# Patient Record
Sex: Female | Born: 1960 | Race: White | Hispanic: No | Marital: Married | Smoking: Never smoker
Health system: Southern US, Community
[De-identification: ages and names within clinical notes are randomized; demographics above are authoritative.]

## PROBLEM LIST (undated history)

## (undated) DIAGNOSIS — E119 Type 2 diabetes mellitus without complications: Secondary | ICD-10-CM

## (undated) DIAGNOSIS — E079 Disorder of thyroid, unspecified: Secondary | ICD-10-CM

## (undated) DIAGNOSIS — F32A Depression, unspecified: Secondary | ICD-10-CM

## (undated) DIAGNOSIS — C541 Malignant neoplasm of endometrium: Secondary | ICD-10-CM

## (undated) DIAGNOSIS — F329 Major depressive disorder, single episode, unspecified: Secondary | ICD-10-CM

## (undated) DIAGNOSIS — N939 Abnormal uterine and vaginal bleeding, unspecified: Secondary | ICD-10-CM

## (undated) DIAGNOSIS — E7800 Pure hypercholesterolemia, unspecified: Secondary | ICD-10-CM

## (undated) DIAGNOSIS — K219 Gastro-esophageal reflux disease without esophagitis: Secondary | ICD-10-CM

## (undated) DIAGNOSIS — G4730 Sleep apnea, unspecified: Secondary | ICD-10-CM

## (undated) DIAGNOSIS — M1990 Unspecified osteoarthritis, unspecified site: Secondary | ICD-10-CM

## (undated) DIAGNOSIS — K635 Polyp of colon: Secondary | ICD-10-CM

## (undated) DIAGNOSIS — E039 Hypothyroidism, unspecified: Secondary | ICD-10-CM

## (undated) DIAGNOSIS — F419 Anxiety disorder, unspecified: Secondary | ICD-10-CM

## (undated) DIAGNOSIS — N289 Disorder of kidney and ureter, unspecified: Secondary | ICD-10-CM

## (undated) HISTORY — DX: Polyp of colon: K63.5

## (undated) HISTORY — DX: Sleep apnea, unspecified: G47.30

## (undated) HISTORY — DX: Malignant neoplasm of endometrium: C54.1

## (undated) HISTORY — PX: OTHER SURGICAL HISTORY: SHX169

---

## 1997-12-22 ENCOUNTER — Encounter: Admission: RE | Admit: 1997-12-22 | Discharge: 1998-03-22 | Payer: Self-pay | Admitting: Endocrinology

## 1998-09-02 ENCOUNTER — Other Ambulatory Visit: Admission: RE | Admit: 1998-09-02 | Discharge: 1998-09-02 | Payer: Self-pay | Admitting: Gynecology

## 1999-08-18 ENCOUNTER — Other Ambulatory Visit: Admission: RE | Admit: 1999-08-18 | Discharge: 1999-08-18 | Payer: Self-pay | Admitting: Gynecology

## 2010-09-11 HISTORY — PX: LAPAROSCOPIC GASTRIC SLEEVE RESECTION: SHX5895

## 2010-10-24 ENCOUNTER — Ambulatory Visit (HOSPITAL_COMMUNITY): Payer: BC Managed Care – PPO | Attending: Endocrinology

## 2010-10-24 ENCOUNTER — Encounter: Payer: Self-pay | Admitting: Cardiology

## 2010-10-24 DIAGNOSIS — R072 Precordial pain: Secondary | ICD-10-CM

## 2010-10-24 DIAGNOSIS — I10 Essential (primary) hypertension: Secondary | ICD-10-CM | POA: Insufficient documentation

## 2010-10-24 DIAGNOSIS — E119 Type 2 diabetes mellitus without complications: Secondary | ICD-10-CM | POA: Insufficient documentation

## 2010-10-24 DIAGNOSIS — Z8249 Family history of ischemic heart disease and other diseases of the circulatory system: Secondary | ICD-10-CM | POA: Insufficient documentation

## 2010-10-25 ENCOUNTER — Encounter: Payer: Self-pay | Admitting: Cardiology

## 2011-03-21 ENCOUNTER — Other Ambulatory Visit: Payer: Self-pay | Admitting: Endocrinology

## 2011-03-21 ENCOUNTER — Ambulatory Visit
Admission: RE | Admit: 2011-03-21 | Discharge: 2011-03-21 | Disposition: A | Discharge status: Home / Self Care | Payer: BC Managed Care – PPO | Source: Ambulatory Visit | Attending: Endocrinology | Admitting: Endocrinology

## 2011-03-21 DIAGNOSIS — R109 Unspecified abdominal pain: Secondary | ICD-10-CM

## 2011-03-21 MED ORDER — IOHEXOL 300 MG/ML  SOLN
125.0000 mL | Freq: Once | INTRAMUSCULAR | Status: AC | PRN
  Administered 2011-03-21: 125 mL via INTRAVENOUS

## 2012-07-17 ENCOUNTER — Other Ambulatory Visit: Payer: Self-pay | Admitting: Gastroenterology

## 2012-07-17 DIAGNOSIS — R109 Unspecified abdominal pain: Secondary | ICD-10-CM

## 2012-07-17 DIAGNOSIS — R112 Nausea with vomiting, unspecified: Secondary | ICD-10-CM

## 2012-07-25 ENCOUNTER — Ambulatory Visit
Admission: RE | Admit: 2012-07-25 | Discharge: 2012-07-25 | Disposition: A | Discharge status: Home / Self Care | Payer: BC Managed Care – PPO | Source: Ambulatory Visit | Attending: Gastroenterology | Admitting: Gastroenterology

## 2012-07-25 DIAGNOSIS — R112 Nausea with vomiting, unspecified: Secondary | ICD-10-CM

## 2012-07-25 DIAGNOSIS — R109 Unspecified abdominal pain: Secondary | ICD-10-CM

## 2016-05-10 ENCOUNTER — Emergency Department (HOSPITAL_COMMUNITY)
Admission: EM | Admit: 2016-05-10 | Discharge: 2016-05-10 | Disposition: A | Discharge status: Home / Self Care | Payer: BLUE CROSS/BLUE SHIELD | Attending: Emergency Medicine | Admitting: Emergency Medicine

## 2016-05-10 ENCOUNTER — Encounter (HOSPITAL_COMMUNITY): Payer: Self-pay

## 2016-05-10 ENCOUNTER — Emergency Department (HOSPITAL_COMMUNITY): Payer: BLUE CROSS/BLUE SHIELD

## 2016-05-10 DIAGNOSIS — R55 Syncope and collapse: Secondary | ICD-10-CM

## 2016-05-10 DIAGNOSIS — Z79899 Other long term (current) drug therapy: Secondary | ICD-10-CM | POA: Diagnosis not present

## 2016-05-10 DIAGNOSIS — Z7984 Long term (current) use of oral hypoglycemic drugs: Secondary | ICD-10-CM | POA: Diagnosis not present

## 2016-05-10 DIAGNOSIS — F329 Major depressive disorder, single episode, unspecified: Secondary | ICD-10-CM

## 2016-05-10 DIAGNOSIS — E119 Type 2 diabetes mellitus without complications: Secondary | ICD-10-CM | POA: Diagnosis not present

## 2016-05-10 DIAGNOSIS — F410 Panic disorder [episodic paroxysmal anxiety] without agoraphobia: Secondary | ICD-10-CM

## 2016-05-10 DIAGNOSIS — R4589 Other symptoms and signs involving emotional state: Secondary | ICD-10-CM

## 2016-05-10 DIAGNOSIS — F419 Anxiety disorder, unspecified: Secondary | ICD-10-CM

## 2016-05-10 DIAGNOSIS — F418 Other specified anxiety disorders: Secondary | ICD-10-CM | POA: Insufficient documentation

## 2016-05-10 HISTORY — DX: Disorder of thyroid, unspecified: E07.9

## 2016-05-10 HISTORY — DX: Pure hypercholesterolemia, unspecified: E78.00

## 2016-05-10 HISTORY — DX: Major depressive disorder, single episode, unspecified: F32.9

## 2016-05-10 HISTORY — DX: Disorder of kidney and ureter, unspecified: N28.9

## 2016-05-10 HISTORY — DX: Type 2 diabetes mellitus without complications: E11.9

## 2016-05-10 HISTORY — DX: Depression, unspecified: F32.A

## 2016-05-10 LAB — URINALYSIS, ROUTINE W REFLEX MICROSCOPIC
Bilirubin Urine: NEGATIVE
Glucose, UA: >1000 mg/dL — AB
Ketones, ur: NEGATIVE mg/dL
Leukocytes, UA: NEGATIVE
Nitrite: NEGATIVE
Protein, ur: NEGATIVE mg/dL
Specific Gravity, Urine: 1.025 (ref 1.005–1.030)
pH: 7 (ref 5.0–8.0)

## 2016-05-10 LAB — RAPID URINE DRUG SCREEN, HOSP PERFORMED
Amphetamines: NOT DETECTED
Barbiturates: NOT DETECTED
Benzodiazepines: NOT DETECTED
Cocaine: NOT DETECTED
Opiates: NOT DETECTED
Tetrahydrocannabinol: NOT DETECTED

## 2016-05-10 LAB — CBC
HCT: 41.9 % (ref 36.0–46.0)
Hemoglobin: 13.8 g/dL (ref 12.0–15.0)
MCH: 28.9 pg (ref 26.0–34.0)
MCHC: 32.9 g/dL (ref 30.0–36.0)
MCV: 87.8 fL (ref 78.0–100.0)
Platelets: 288 10*3/uL (ref 150–400)
RBC: 4.77 MIL/uL (ref 3.87–5.11)
RDW: 13.6 % (ref 11.5–15.5)
WBC: 8.6 10*3/uL (ref 4.0–10.5)

## 2016-05-10 LAB — BASIC METABOLIC PANEL
Anion gap: 7 (ref 5–15)
BUN: 18 mg/dL (ref 6–20)
CO2: 27 mmol/L (ref 22–32)
Calcium: 9.4 mg/dL (ref 8.9–10.3)
Chloride: 106 mmol/L (ref 101–111)
Creatinine, Ser: 0.94 mg/dL (ref 0.44–1.00)
GFR calc Af Amer: >60 mL/min (ref >60)
GFR calc non Af Amer: >60 mL/min (ref >60)
Glucose, Bld: 182 mg/dL — ABNORMAL HIGH (ref 65–99)
Potassium: 4.5 mmol/L (ref 3.5–5.1)
Sodium: 140 mmol/L (ref 135–145)

## 2016-05-10 LAB — URINE MICROSCOPIC-ADD ON

## 2016-05-10 LAB — ETHANOL: Alcohol, Ethyl (B): <5 mg/dL (ref <5)

## 2016-05-10 LAB — CBG MONITORING, ED: Glucose-Capillary: 216 mg/dL — ABNORMAL HIGH (ref 65–99)

## 2016-05-10 LAB — ACETAMINOPHEN LEVEL: Acetaminophen (Tylenol), Serum: <10 ug/mL — ABNORMAL LOW (ref 10–30)

## 2016-05-10 LAB — POC URINE PREG, ED: Preg Test, Ur: NEGATIVE

## 2016-05-10 LAB — I-STAT TROPONIN, ED: Troponin i, poc: 0 ng/mL (ref 0.00–0.08)

## 2016-05-10 LAB — SALICYLATE LEVEL: Salicylate Lvl: <4 mg/dL (ref 2.8–30.0)

## 2016-05-10 MED ORDER — HYDROXYZINE HCL 10 MG PO TABS: 10.0000 mg | ORAL_TABLET | Freq: Four times a day (QID) | ORAL | 0 refills | Status: DC | PRN

## 2016-05-10 NOTE — ED Provider Notes (Signed)
[facility] DEPT Provider Note   CSN: [id number] Arrival date & time: 05/10/16  1040     History   Chief Complaint Chief Complaint  Patient presents with  . Loss of Consciousness    HPI April Scott is a 55 y.o. female.  April Scott is a 55 y.o. Female who presents to the ED via EMS after a syncopal episode at work today. The patient reports she has been very stressed at work recently. She reports today she came back to her desk and felt very overwhelmed. She felt she had a panic attack and passed out falling to the ground from her chair. She reports feeling like she was breathing quickly and had a feeling of panic. She reports history of panic attacks and this felt similar. She denies having chest pain or shortness of breath. She reports over the past several months she has had intermittent chest tightness and saw cardiology and had an EKG. Cardiology reassured her, but plan is for a stress test at some point. She reports history of depression and has felt depressed recently. She reports . taking antidepressants have not helped her in the past. She denies SI or HI today, but does reports she has wondered "why am I here?" She reports a generalized headache currently. She denies fevers, abdominal pain, nausea, vomiting, diarrhea, chest pain, shortness of breath, palpitations, neck pain, changes to her vision, urinary symptoms or rashes.   The history is provided by the patient. No language interpreter was used.  Loss of Consciousness   Associated symptoms include headaches. Pertinent negatives include abdominal pain, back pain, chest pain, congestion, dizziness, fever, light-headedness, nausea, palpitations, vomiting and weakness.    Past Medical History:  Diagnosis Date  . Depression   . Diabetes mellitus without complication ([place])   . Hypercholesteremia   . Renal disorder   . Thyroid disease     There are no active problems to display for this patient.   Past  Surgical History:  Procedure Laterality Date  . LAPAROSCOPIC GASTRIC SLEEVE RESECTION    . spleen repair      OB History    No data available       Home Medications    Prior to Admission medications   Medication Sig Start Date End Date Taking? Authorizing Provider  atorvastatin (LIPITOR) 10 MG tablet Take 10 mg by mouth daily.    Yes Historical Provider, MD  empagliflozin (JARDIANCE) 10 MG TABS tablet Take 10 mg by mouth daily.   Yes Historical Provider, MD  levothyroxine (SYNTHROID, LEVOTHROID) 25 MCG tablet Take 25 mcg by mouth daily before breakfast.    Yes Historical Provider, MD  LINAGLIPTIN-METFORMIN HCL PO Take 1 tablet by mouth daily.   Yes Historical Provider, MD  hydrOXYzine (ATARAX/VISTARIL) 10 MG tablet Take 1 tablet (10 mg total) by mouth every 6 (six) hours as needed for anxiety. 05/10/16   Waynetta Pean, PA-C    Family History History reviewed. No pertinent family history.  Social History Social History  Substance Use Topics  . Smoking status: Never Smoker  . Smokeless tobacco: Never Used  . Alcohol use No     Allergies   Review of patient's allergies indicates no known allergies.   Review of Systems Review of Systems  Constitutional: Negative for chills and fever.  HENT: Negative for congestion and sore throat.   Eyes: Negative for visual disturbance.  Respiratory: Negative for cough and shortness of breath.   Cardiovascular: Positive for syncope. Negative for chest pain and  palpitations.  Gastrointestinal: Negative for abdominal pain, diarrhea, nausea and vomiting.  Genitourinary: Negative for difficulty urinating, dysuria and hematuria.  Musculoskeletal: Negative for back pain and neck pain.  Skin: Negative for rash.  Neurological: Positive for syncope and headaches. Negative for dizziness, weakness, light-headedness and numbness.     Physical Exam Updated Vital Signs BP 114/71 (BP Location: Left Arm)   Pulse 89   Temp 98.2 F (36.8 C)  (Oral)   Resp 20   Ht 5\' 1"  (1.549 m)   Wt 108 kg   SpO2 98%   BMI 44.97 kg/m   Physical Exam  Constitutional: She is oriented to person, place, and time. She appears well-developed and well-nourished. No distress.  Nontoxic-appearing obese female.  HENT:  Head: Normocephalic and atraumatic.  Right Ear: External ear normal.  Left Ear: External ear normal.  Mouth/Throat: Oropharynx is clear and moist.  Eyes: Conjunctivae and EOM are normal. Pupils are equal, round, and reactive to light. Right eye exhibits no discharge. Left eye exhibits no discharge.  Neck: Normal range of motion. Neck supple. No JVD present.  Cardiovascular: Normal rate, regular rhythm, normal heart sounds and intact distal pulses.  Exam reveals no gallop and no friction rub.   No murmur heard. Bilateral radial and dorsalis pedis pulses are intact.  Pulmonary/Chest: Effort normal and breath sounds normal. No stridor. No respiratory distress. She has no wheezes. She has no rales.  Lungs are clear to auscultation bilaterally.  Abdominal: Soft. There is no tenderness. There is no guarding.  Musculoskeletal: She exhibits no edema or tenderness.  Lymphadenopathy:    She has no cervical adenopathy.  Neurological: She is alert and oriented to person, place, and time. No cranial nerve deficit. Coordination normal.  Patient is alert and oriented 3. Cranial nerves are intact. Speech is clear and coherent. Sensation is intact her bilateral upper and lower extremities. Good strength to her bilateral upper and lower extremities. Vision is grossly intact. EOMs are intact.  Skin: Skin is warm and dry. Capillary refill takes less than 2 seconds. No rash noted. She is not diaphoretic. No erythema. No pallor.  Psychiatric: Her behavior is normal. Her mood appears anxious. She exhibits a depressed mood.  Patient appears anxious and depressed. She is slightly tearful during exam. She denies SI or  HI.   Nursing note and vitals  reviewed.    ED Treatments / Results  Labs (all labs ordered are listed, but only abnormal results are displayed) Labs Reviewed  BASIC METABOLIC PANEL - Abnormal; Notable for the following:       Result Value   Glucose, Bld 182 (*)    All other components within normal limits  URINALYSIS, ROUTINE W REFLEX MICROSCOPIC (NOT AT [facility]) - Abnormal; Notable for the following:    Glucose, UA >1000 (*)    Hgb urine dipstick SMALL (*)    All other components within normal limits  ACETAMINOPHEN LEVEL - Abnormal; Notable for the following:    Acetaminophen (Tylenol), Serum <10 (*)    All other components within normal limits  URINE MICROSCOPIC-ADD ON - Abnormal; Notable for the following:    Squamous Epithelial / LPF 6-30 (*)    Bacteria, UA RARE (*)    All other components within normal limits  CBG MONITORING, ED - Abnormal; Notable for the following:    Glucose-Capillary 216 (*)    All other components within normal limits  CBC  ETHANOL  URINE RAPID DRUG SCREEN, HOSP PERFORMED  SALICYLATE LEVEL  Randolm Idol, ED  POC URINE PREG, ED    EKG  EKG Interpretation  Date/Time:  Wednesday May 10 2016 11:07:01 EDT Ventricular Rate:  85 PR Interval:    QRS Duration: 87 QT Interval:  352 QTC Calculation: 419 R Axis:   148 Text Interpretation:  Right and left arm electrode reversal, interpretation assumes no reversal Sinus or ectopic atrial rhythm Probable lateral infarct, age indeterminate No acute changes Nonspecific ST abnormality Confirmed by Kathrynn Humble, MD, Thelma Comp [phone]) on 05/10/2016 2:27:09 PM       Radiology Dg Chest 2 View  Result Date: 05/10/2016 CLINICAL DATA:  Syncope EXAM: CHEST  2 VIEW COMPARISON:  None. FINDINGS: The heart size and mediastinal contours are within normal limits. Both lungs are clear. The visualized skeletal structures are unremarkable. IMPRESSION: No active cardiopulmonary disease. Electronically Signed   By: Franchot Gallo M.D.   On: 05/10/2016 13:04      Procedures Procedures (including critical care time)  Medications Ordered in ED Medications - No data to display   Initial Impression / Assessment and Plan / ED Course  I have reviewed the triage vital signs and the nursing notes.  Pertinent labs & imaging results that were available during my care of the patient were reviewed by me and considered in my medical decision making (see chart for details).  Clinical Course   This is a 55 y.o. Female who presents to the ED via EMS after a syncopal episode at work today. The patient reports she has been very stressed at work recently. She reports today she came back to her desk and felt very overwhelmed. She felt she had a panic attack and passed out falling to the ground from her chair. She reports feeling like she was breathing quickly and had a feeling of panic. She reports history of panic attacks and this felt similar. She denies having chest pain or shortness of breath. She reports over the past several months she has had intermittent chest tightness and saw cardiology and had an EKG. Cardiology reassured her, but plan is for a stress test at some point. She reports history of depression and has felt depressed recently. She reports . taking antidepressants have not helped her in the past. She denies SI or HI today, but does reports she has wondered "why am I here?" She has had no chest pain or shortness of breath.  On exam patient is afebrile nontoxic-appearing. She has no focal neurological deficits. She endorses feeling depressed but denies suicidal or homicidal ideations. Will have Behavioral Health speak to her. Workup initiated for syncope. Troponin is not elevated. Ethanol is 0. Acetaminophen and salicylate levels are negative. Pregnancy test is negative. Urine shows no sign of infection. BMP is remarkable only for glucose of 182. CBC is within normal limits. Chest x-ray is unremarkable. EKG showed no sign of STEMI. She is Well's criteria  negative.  Patient believes she had a stress panic attack and this caused her to pass out. I agree with this assessment. No concerning findings on workup. Today. Patient was evaluated by behavioral health team who feels she does not meet inpatient criteria and will provide her with outpatient resources. Recheck patient reports she is feeling better. She is not lightheaded with position change. She feels ready for discharge. Will provide her with a short course of Atarax for anxiety. She plans to make a follow-up with her primary care doctor for tomorrow. I also encouraged her to seek appointment for follow-up with a psychiatrist, therapist  and her cardiologist to have the exercise stress test. I discussed strict and specific return precautions. I advised the patient to follow-up with their primary care provider this week. I advised the patient to return to the emergency department with new or worsening symptoms or new concerns. The patient verbalized understanding and agreement with plan.       Final Clinical Impressions(s) / ED Diagnoses   Final diagnoses:  Syncope and collapse  Anxiety  Depressed mood  Panic attack    New Prescriptions New Prescriptions   HYDROXYZINE (ATARAX/VISTARIL) 10 MG TABLET    Take 1 tablet (10 mg total) by mouth every 6 (six) hours as needed for anxiety.         Waynetta Pean, PA-C 05/10/16 1432    Varney Biles, MD 05/10/16 [phone]

## 2016-05-10 NOTE — ED Triage Notes (Signed)
During assessment.  Pt states she has been really anxious and depressed within last 2 months.  Pt states she wonders why she is even here in life.  Has had thoughts that she would be better off dying.  Has no specific plan.  High stress job.  Recently married in May.  States she feels like she is having a "nervous breakdown".  Pt states she has been given meds for her depression in past and they make her "suicidal".  To the point "I was planning".  States occurred 3 years ago.  She had been taking med x 3 weeks and told her family she was going to kill herself.  Took herself off her med.  Not taking anything right now.  Spouse is supportive.

## 2016-05-10 NOTE — Discharge Instructions (Addendum)
For your ongoing behavioral health needs, you are advised to follow up with one of the following providers for outpatient psychiatry and therapy.  Contact them at your earliest opportunity to schedule an intake appointment:       [facility] [place], [place] [address]      [phone]       [place]      [place]      [place], [place] [address]      [phone]       [facility] [place]      [address] [place] [address]      [place], [place] [address]      [phone]  Substance Abuse Treatment Programs  Intensive Outpatient Programs [place]     [place]. [place], [place]       [facility] [place] [address] [place], [place]  [facility]     (Inpatient and outpatient)     [address]           [place] [phone] (Suboxone and Methadone)  [place], [place] [address]      [place] [phone] [place], [place]  [facility] [place] (Outpatient/Inpatient, [place])    (insurance only) [phone]             [facility] ([place]) [place], [place]     [facility]     [address]     [place], [place]       [facility] (for caregivers and family) [phone] [address] Kristeen Mans. [place], [place]      Residential Treatment Programs [facility]      [address] [place], [place] [address]  [phone]       [facility] [address] [place], [place] [address] [phone]  [facility] [place]        [phone]       [facility] [place] [phone]  [facility] ([place]             [place], [place] or [place] of [place], [address] [phone]  [facility] [place]    [place], [place]  Dike Gholson, [place]  [place]   [address] [place], [place] [address]     [phone]      Admissions: 8am-3pm M-F  [facility] (RT[facility]) [address] [place], [place]  [facility] Program: Residential Program [phone] Days)   [place], [place] or [phone]     ADATC: [facility] [place], [place] (Walk in Hours over the weekend or by referral)  [facility] [place], [place], [place] [address] [phone]  Crisis Mobile: [facility]:  [phone] (for crisis response 24 hours a day) [facility] Hotline:      9705126265 Outpatient Psychiatry and Counseling  [facility]: Mobile Crisis Management 24 hours:  [phone]  [facility] of the [employer] sliding scale fee and walk in schedule: M-F 8am-12pm/1pm-3pm [place], [place] [address] [address] [place], [place] [address] [phone]  [facility] (Formerly known as [facility] [employer])- new patient walk-in appointments available Monday - Friday 8am -3pm.          [address] [place], [place] [address] [phone] or crisis line- [facility]/ Intensive Outpatient Therapy Program [place], [place] [address] [place]      [phone] [place] [place], [place]  [address]                 [place]   [facility] [phone]. [address] [place], [place] [address]   [employer] of Care          [address] Johnette Abraham  [place], [place] [address]       [phone]  [facility] [address], [place], [place] [address] [phone]  [facility]    [address] [place], [place] [address]     [place], [place] Joycelyn Man     [place] [address]     [phone]       [facility] [place] [address]  [facility]     [address], [place], MD [place], [place] [address] [place]     [address]     [place], [place] [address]     [phone]       [facility] [address] [place], [place] [address] [phone] Resources for Temporary Residential Assistance/Crisis [place]) M-F 8am-3pm   [address], [place] [address]   [phone] Services include: laundry, barbering, support groups, case management, phone  & computer access, showers, AA/NA mtgs, mental health/substance abuse nurse, job skills class, disability information, VA assistance, spiritual classes, etc.   HOMELESS [place]  [facility]   [address], [place]     [phone]              [employer] (women and children)       [place]. [place], [place] [address] [phone] [email or web address] for application and process Application Required  [facility]   [address]    [place] [address]     [phone]                    [place]  [place], [place] [address] [id number] application appt.) Application Required  [facility] (women only)    [address]     [place], [place] [address]     [phone]      Intake starts 6pm daily Need valid ID, SSC, & Police report [employer] [address] [place], [place] [id number] Application Required  [employer] (men only)     [place].      [place], [place]       [place] (Pregnant women only) [address] [place], [place]  [facility]      [place] Dani [address].      [place], [place] [address]     [phone]             [facility] [address] [place], [place] 90 day commitment/SA/Application process  [facility](men only)     [address]     [place], [place]       Check-in at [facility] of [facility] [address] [place], [place] [address] [phone] Men/Women/Women and Children must be there by 7 pm  [place], [place]
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
## 2016-05-10 NOTE — ED Notes (Signed)
Bed: WA08 Expected date:  Expected time:  Means of arrival:  Comments: EMS- 55yo F, syncope/unwitnessed

## 2016-05-10 NOTE — ED Triage Notes (Signed)
Per EMS, pt from work.  Pt had syncopal episode at around 10am.  Pt sitting in chair and fell to floor.  ? Witnessed but multiple people were around.  LOC less than a minute.  Pt has anxiety recently at work.  Has had cardiology check up within last couple days which was negative.  Scrape on elbow.  Denies pain.  Pt did strike head on floor. Vitals:  bp 143/71, hr 94, resp 16, 99% ra, cbg 315.  Pt has not taking her insulin yet today.

## 2016-07-13 DIAGNOSIS — Z1231 Encounter for screening mammogram for malignant neoplasm of breast: Secondary | ICD-10-CM | POA: Diagnosis not present

## 2016-07-26 DIAGNOSIS — R300 Dysuria: Secondary | ICD-10-CM | POA: Diagnosis not present

## 2016-07-26 DIAGNOSIS — N3090 Cystitis, unspecified without hematuria: Secondary | ICD-10-CM | POA: Diagnosis not present

## 2016-08-17 DIAGNOSIS — Z9884 Bariatric surgery status: Secondary | ICD-10-CM | POA: Diagnosis not present

## 2016-08-17 DIAGNOSIS — E559 Vitamin D deficiency, unspecified: Secondary | ICD-10-CM | POA: Diagnosis not present

## 2016-08-17 DIAGNOSIS — R740 Nonspecific elevation of levels of transaminase and lactic acid dehydrogenase [LDH]: Secondary | ICD-10-CM | POA: Diagnosis not present

## 2016-08-17 DIAGNOSIS — E1165 Type 2 diabetes mellitus with hyperglycemia: Secondary | ICD-10-CM | POA: Diagnosis not present

## 2016-08-23 DIAGNOSIS — F320 Major depressive disorder, single episode, mild: Secondary | ICD-10-CM | POA: Diagnosis not present

## 2016-11-15 DIAGNOSIS — Z9884 Bariatric surgery status: Secondary | ICD-10-CM | POA: Diagnosis not present

## 2016-11-15 DIAGNOSIS — Z1389 Encounter for screening for other disorder: Secondary | ICD-10-CM | POA: Diagnosis not present

## 2016-11-15 DIAGNOSIS — E11621 Type 2 diabetes mellitus with foot ulcer: Secondary | ICD-10-CM | POA: Diagnosis not present

## 2016-11-15 DIAGNOSIS — E1165 Type 2 diabetes mellitus with hyperglycemia: Secondary | ICD-10-CM | POA: Diagnosis not present

## 2016-11-15 DIAGNOSIS — E038 Other specified hypothyroidism: Secondary | ICD-10-CM | POA: Diagnosis not present

## 2017-03-07 DIAGNOSIS — Z6841 Body Mass Index (BMI) 40.0 and over, adult: Secondary | ICD-10-CM | POA: Diagnosis not present

## 2017-03-07 DIAGNOSIS — Z01419 Encounter for gynecological examination (general) (routine) without abnormal findings: Secondary | ICD-10-CM | POA: Diagnosis not present

## 2017-03-07 DIAGNOSIS — Z124 Encounter for screening for malignant neoplasm of cervix: Secondary | ICD-10-CM | POA: Diagnosis not present

## 2017-03-07 DIAGNOSIS — N950 Postmenopausal bleeding: Secondary | ICD-10-CM | POA: Diagnosis not present

## 2017-03-07 DIAGNOSIS — C541 Malignant neoplasm of endometrium: Secondary | ICD-10-CM | POA: Diagnosis not present

## 2017-03-12 ENCOUNTER — Telehealth: Payer: Self-pay | Admitting: *Deleted

## 2017-03-12 NOTE — Telephone Encounter (Signed)
Received new patient referral from [place]. Patient schedueld for July 5th at 11am: arrive at 10:30am. Karalee Height at that office and she will contact the patient.

## 2017-03-15 ENCOUNTER — Encounter: Payer: Self-pay | Admitting: Gynecologic Oncology

## 2017-03-15 ENCOUNTER — Ambulatory Visit: Payer: BLUE CROSS/BLUE SHIELD | Attending: Gynecologic Oncology | Admitting: Gynecologic Oncology

## 2017-03-15 VITALS — BP 136/76 | HR 86 | Temp 98.4°F | Resp 20 | Ht 62.0 in | Wt 238.4 lb

## 2017-03-15 DIAGNOSIS — Z7984 Long term (current) use of oral hypoglycemic drugs: Secondary | ICD-10-CM | POA: Insufficient documentation

## 2017-03-15 DIAGNOSIS — E7800 Pure hypercholesterolemia, unspecified: Secondary | ICD-10-CM | POA: Diagnosis not present

## 2017-03-15 DIAGNOSIS — Z801 Family history of malignant neoplasm of trachea, bronchus and lung: Secondary | ICD-10-CM | POA: Insufficient documentation

## 2017-03-15 DIAGNOSIS — F329 Major depressive disorder, single episode, unspecified: Secondary | ICD-10-CM | POA: Diagnosis not present

## 2017-03-15 DIAGNOSIS — E119 Type 2 diabetes mellitus without complications: Secondary | ICD-10-CM | POA: Insufficient documentation

## 2017-03-15 DIAGNOSIS — N289 Disorder of kidney and ureter, unspecified: Secondary | ICD-10-CM | POA: Diagnosis not present

## 2017-03-15 DIAGNOSIS — Z79899 Other long term (current) drug therapy: Secondary | ICD-10-CM | POA: Insufficient documentation

## 2017-03-15 DIAGNOSIS — Z9884 Bariatric surgery status: Secondary | ICD-10-CM

## 2017-03-15 DIAGNOSIS — E079 Disorder of thyroid, unspecified: Secondary | ICD-10-CM | POA: Diagnosis not present

## 2017-03-15 DIAGNOSIS — Z8041 Family history of malignant neoplasm of ovary: Secondary | ICD-10-CM | POA: Diagnosis not present

## 2017-03-15 DIAGNOSIS — E669 Obesity, unspecified: Secondary | ICD-10-CM | POA: Insufficient documentation

## 2017-03-15 DIAGNOSIS — C541 Malignant neoplasm of endometrium: Secondary | ICD-10-CM

## 2017-03-15 DIAGNOSIS — Z6841 Body Mass Index (BMI) 40.0 and over, adult: Secondary | ICD-10-CM | POA: Diagnosis not present

## 2017-03-15 DIAGNOSIS — Z806 Family history of leukemia: Secondary | ICD-10-CM | POA: Insufficient documentation

## 2017-03-15 NOTE — Progress Notes (Signed)
Consult Note: Gyn-Onc  Consult was requested by Dr. Alden Scott for the evaluation of April Scott 56 y.o. female  CC:  Chief Complaint  Patient presents with  . Endometrial Cancer    Assessment/Plan:  April Scott is a 56 y.o.  with grade 2 endometrioid endometrial cancer.  Recommendation was made for robotic hysterectomy bilateral sentinel lymph node dissection possible lymph node dissection possible open procedure I extensively reviewed the risks of robotic hysterectomy and possible lymph node dissection of infection, bleeding, damage to nearby organs including bowel, bladder, vessels, nerves, and ureters. We discussed postoperative risks including infection, PE/ DVT, and lymphedema. We reviewed possible need for conversion to open laparotomy, need for possible blood transfusion. I discussed positioning during surgery of trendelenberg and risks of minor facial swelling and care we take in preoperative positioning. We also reviewed possible need for further treatment such as radiation or chemotherapy based on final pathology. Expected postoperative recovery was also discussed.  April Scott and her husband are aware that poor glucose control than obesity markedly elevated the morbidity of this procedure.  The procedure is planned for 03/20/2017 with Dr. Denman Scott.  Will check hemoglobin A1c. April Scott was counseled to optimize her glucose control from today until at least 2 weeks after surgery.   HPI: Ms. April Scott is a 56 y.o.  gravida 2 para 0 reports intermittent vaginal spotting for approximately 6 months. The longest continuous flow lasted for approximately 3 weeks. Her last normal menstrual period was approximately one half years ago.  An endometrial biopsy collected on 06/2 27 2018 is notable for moderately differentiated endometrioid adenocarcinoma  (uterus sounded to 9 cm).  April Scott reports intermittent vaginal spotting since that  procedure. Her history is notable for sending type 2 diabetes mellitus with a hemoglobin A1c of 7.2. The last time she checked a fasting blood sugar was 3 weeks ago in the value was 120.  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 Her weight loss stalled out at 72 pounds.   Review of Systems:  Constitutional  Feels well,  Cardiovascular  No chest pain, shortness of breath, or edema  Pulmonary  No cough or wheeze.  Gastro Intestinal  No nausea, vomitting, or diarrhoea. No bright red blood per rectum, no abdominal pain, change in bowel movement, or constipation.  Genito Urinary  No frequency, urgency, dysuria, Vaginal spotting  Musculo Skeletal  No myalgia, arthralgia, joint swelling or pain  Neurologic  No weakness, numbness, change in gait,  Psychology  No depression, anxiety, insomnia.    Current Meds:  Outpatient Encounter Prescriptions as of 03/15/2017  Medication Sig  . aspirin EC 81 MG tablet Take 81 mg by mouth daily.  Marland Kitchen atorvastatin (LIPITOR) 10 MG tablet Take 10 mg by mouth daily.   . empagliflozin (JARDIANCE) 10 MG TABS tablet Take 10 mg by mouth daily.  . hydrOXYzine (ATARAX/VISTARIL) 10 MG tablet Take 1 tablet (10 mg total) by mouth every 6 (six) hours as needed for anxiety.  Marland Kitchen ibuprofen (ADVIL,MOTRIN) 200 MG tablet Take 800-1,000 mg by mouth 2 (two) times daily as needed.  . JENTADUETO XR 01-999 MG TB24   . levothyroxine (SYNTHROID) 200 MCG tablet Synthroid 200 mcg tablet  . LINAGLIPTIN-METFORMIN HCL PO Take 1 tablet by mouth daily.  Marland Kitchen lisinopril (PRINIVIL,ZESTRIL) 2.5 MG tablet   . VIIBRYD 20 MG TABS   . [DISCONTINUED] aspirin (BAYER ASPIRIN) 325 MG tablet Bayer Aspirin  . HYDROcodone-acetaminophen (NORCO/VICODIN) 5-325  MG tablet hydrocodone 5 mg-acetaminophen 325 mg tablet  . [DISCONTINUED] levothyroxine (SYNTHROID, LEVOTHROID) 25 MCG tablet Take 25 mcg by mouth daily before breakfast.    No  facility-administered encounter medications on file as of 03/15/2017.     Allergy: No Known Allergies  Social Hx:   Social History   Social History  . Marital status: Divorced    Spouse name: N/A  . Number of children: N/A  . Years of education: N/A   Occupational History  . Not on file.   Social History Main Topics  . Smoking status: Never Smoker  . Smokeless tobacco: Never Used  . Alcohol use No  . Drug use: No  . Sexual activity: Not on file   Other Topics Concern  . Not on file   Social History Narrative  . No narrative on file    Past Surgical Hx:  Past Surgical History:  Procedure Laterality Date  . LAPAROSCOPIC GASTRIC SLEEVE RESECTION    . spleen repair      Past Medical Hx:  Past Medical History:  Diagnosis Date  . Depression   . Diabetes mellitus without complication ([place])   . Hypercholesteremia   . Renal disorder   . Thyroid disease   Mammogram 2017 within normal limits colonoscopy in 2017 removal of 2 benign polyps colonoscopy 5 years prior to that also associated removal of polyps  Past Gynecological History:   Gravida 2 para 0 last normal menstrual period 1-1/2 years ago menarche at age 75 irregular menses throughout adulthood   Family Hx: Maternal aunt with ovarian cancer diagnosis 53 years of age father with leukemia in early adulthood and lung cancer in later adulthood  Vitals:  Blood pressure 136/76, pulse 86, temperature 98.4 F (36.9 C), resp. rate 20, height 5\' 2"  (1.575 m), weight 238 lb 6.4 oz (108.1 kg), SpO2 97 %. Body mass index is 43.6 kg/m.   Physical Exam: WD in NAD Neck  Supple NROM, without any enlargements.  Lymph Node Survey No cervical supraclavicular or inguinal adenopathy Cardiovascular  Pulse normal rate, regularity and rhythm.  Lungs  Clear to auscultation bilaterally, without wheezes/crackles/rhonchi. Good air movement.  Psychiatry  Alert and oriented appropriate mood affect speech and reasoning. Abdomen   Normoactive bowel sounds, abdomen soft, non-tender. Back No CVA tenderness Genito Urinary  Vulva/vagina: Normal external female genitalia.  No lesions.Trace bleeding.  Bladder/urethra:  No lesions or masses  Vagina: Well estrogenized trace blood within the vaginal vault  Cervix: Normal appearing, nulliparous well supported no lesions.  Uterus: Mobile, no parametrial involvement or nodularity. Unable to assess size because of body habitus   Adnexa: Unable to assess because of body habitus Rectal  Good tone, no masses no cul de sac nodularity.  Extremities  No bilateral cyanosis, clubbing or edema.   Janie Morning, MD, PhD 03/15/2017, 3:47 PM

## 2017-03-15 NOTE — Patient Instructions (Addendum)
April Scott  03/15/2017   Your procedure is scheduled on: Tuesday 03/20/2017  Report to [facility] Main  Entrance Take [place]  elevators to 3rd floor to  [place] at  0900 AM.     Call this number if you have problems the morning of surgery [phone]   Remember: ONLY 1 PERSON MAY GO WITH YOU TO SHORT STAY TO GET  READY MORNING OF April Scott.   Eat a light diet the day before surgery.  Examples including soups, broths, toast, yogurt, mashed potatoes.  Things to avoid include carbonated beverages (fizzy beverages), raw fruits and raw vegetables, or beans.   If your bowels are filled with gas, your surgeon will have difficulty visualizing your pelvic organs which increases your surgical risks.    CLEAR LIQUID DIET   Foods Allowed                                                                     Foods Excluded  Coffee and tea, regular and decaf                             liquids that you cannot  Plain Jell-O in any flavor                                             see through such as: Fruit ices (not with fruit pulp)                                     milk, soups, orange juice  Iced Popsicles                                    All solid food                                   Cranberry, grape and apple juices Sports drinks like Gatorade Lightly seasoned clear broth or consume(fat free) Sugar, honey syrup  Sample Menu Breakfast                                Lunch                                     Supper Cranberry juice                    Beef broth                            Chicken broth Jell-O  Grape juice                           Apple juice Coffee or tea                        Jell-O                                      Popsicle                                                Coffee or tea                        Coffee or  tea  _____________________________________________________________________  How to Manage Your Diabetes Before and After Surgery  Why is it important to control my blood sugar before and after surgery? . Improving blood sugar levels before and after surgery helps healing and can limit problems. . A way of improving blood sugar control is eating a healthy diet by: o  Eating less sugar and carbohydrates o  Increasing activity/exercise o  Talking with your doctor about reaching your blood sugar goals . High blood sugars (greater than 180 mg/dL) can raise your risk of infections and slow your recovery, so you will need to focus on controlling your diabetes during the weeks before surgery. . Make sure that the doctor who takes care of your diabetes knows about your planned surgery including the date and location.  How do I manage my blood sugar before surgery? . Check your blood sugar at least 4 times a day, starting 2 days before surgery, to make sure that the level is not too high or low. o Check your blood sugar the morning of your surgery when you wake up and every 2 hours until you get to the Short Stay unit. . If your blood sugar is less than 70 mg/dL, you will need to treat for low blood sugar: o Do not take insulin. o Treat a low blood sugar (less than 70 mg/dL) with  cup of clear juice (cranberry or apple), 4 glucose tablets, OR glucose gel. o Recheck blood sugar in 15 minutes after treatment (to make sure it is greater than 70 mg/dL). If your blood sugar is not greater than 70 mg/dL on recheck, call [phone] for further instructions. . Report your blood sugar to the short stay nurse when you get to Short Stay.  . If you are admitted to the hospital after surgery: o Your blood sugar will be checked by the staff and you will probably be given insulin after surgery (instead of oral diabetes medicines) to make sure you have good blood sugar levels. o The goal for blood sugar control  after surgery is 80-180 mg/dL.   WHAT DO I DO ABOUT MY DIABETES MEDICATION?   . THE DAY BEFORE SURGERY TAKE USUAL DOSES OF JARDIANCE AND JENTADUETO.   . THE DAY OF SURGERY DO NOT TAKE ANY DIABETES MEDICATIONS         Do not eat food or drink liquids :After Midnight.     Take these medicines the morning of surgery with A SIP OF WATER: ARTHRITIS STRENGTH TYLENOL IF  NEEEDED                                  You may not have any metal on your body including hair pins and              piercings  Do not wear jewelry, make-up, lotions, powders or perfumes, deodorant             Do not wear nail polish.  Do not shave  48 hours prior to surgery.              Men may shave face and neck.   Do not bring valuables to the hospital. [place].  Contacts, dentures or bridgework may not be worn into surgery.  Leave suitcase in the car. After surgery it may be brought to your room.                 Please read over the following fact sheets you were given: _____________________________________________________________________             [facility] - Preparing for Surgery Before surgery, you can play an important role.  Because skin is not sterile, your skin needs to be as free of germs as possible.  You can reduce the number of germs on your skin by washing with CHG (chlorahexidine gluconate) soap before surgery.  CHG is an antiseptic cleaner which kills germs and bonds with the skin to continue killing germs even after washing. Please DO NOT use if you have an allergy to CHG or antibacterial soaps.  If your skin becomes reddened/irritated stop using the CHG and inform your nurse when you arrive at Short Stay. Do not shave (including legs and underarms) for at least 48 hours prior to the first CHG shower.  You may shave your face/neck. Please follow these instructions carefully:  1.  Shower with CHG Soap the night before surgery and the   morning of Surgery.  2.  If you choose to wash your hair, wash your hair first as usual with your  normal  shampoo.  3.  After you shampoo, rinse your hair and body thoroughly to remove the  shampoo.                           4.  Use CHG as you would any other liquid soap.  You can apply chg directly  to the skin and wash                       Gently with a scrungie or clean washcloth.  5.  Apply the CHG Soap to your body ONLY FROM THE NECK DOWN.   Do not use on face/ open                           Wound or open sores. Avoid contact with eyes, ears mouth and genitals (private parts).                       Wash face,  Genitals (private parts) with your normal soap.             6.  Wash thoroughly, paying special attention to the area  where your surgery  will be performed.  7.  Thoroughly rinse your body with warm water from the neck down.  8.  DO NOT shower/wash with your normal soap after using and rinsing off  the CHG Soap.                9.  Pat yourself dry with a clean towel.            10.  Wear clean pajamas.            11.  Place clean sheets on your bed the night of your first shower and do not  sleep with pets. Day of Surgery : Do not apply any lotions/deodorants the morning of surgery.  Please wear clean clothes to the hospital/surgery center.  FAILURE TO FOLLOW THESE INSTRUCTIONS MAY RESULT IN THE CANCELLATION OF YOUR SURGERY PATIENT SIGNATURE_________________________________  NURSE SIGNATURE__________________________________  ________________________________________________________________________   Adam Phenix  An incentive spirometer is a tool that can help keep your lungs clear and active. This tool measures how well you are filling your lungs with each breath. Taking long deep breaths may help reverse or decrease the chance of developing breathing (pulmonary) problems (especially infection) following:  A long period of time when you are unable to move or be  active. BEFORE THE PROCEDURE   If the spirometer includes an indicator to show your best effort, your nurse or respiratory therapist will set it to a desired goal.  If possible, sit up straight or lean slightly forward. Try not to slouch.  Hold the incentive spirometer in an upright position. INSTRUCTIONS FOR USE  1. Sit on the edge of your bed if possible, or sit up as far as you can in bed or on a chair. 2. Hold the incentive spirometer in an upright position. 3. Breathe out normally. 4. Place the mouthpiece in your mouth and seal your lips tightly around it. 5. Breathe in slowly and as deeply as possible, raising the piston or the ball toward the top of the column. 6. Hold your breath for 3-5 seconds or for as long as possible. Allow the piston or ball to fall to the bottom of the column. 7. Remove the mouthpiece from your mouth and breathe out normally. 8. Rest for a few seconds and repeat Steps 1 through 7 at least 10 times every 1-2 hours when you are awake. Take your time and take a few normal breaths between deep breaths. 9. The spirometer may include an indicator to show your best effort. Use the indicator as a goal to work toward during each repetition. 10. After each set of 10 deep breaths, practice coughing to be sure your lungs are clear. If you have an incision (the cut made at the time of surgery), support your incision when coughing by placing a pillow or rolled up towels firmly against it. Once you are able to get out of bed, walk around indoors and cough well. You may stop using the incentive spirometer when instructed by your caregiver.  RISKS AND COMPLICATIONS  Take your time so you do not get dizzy or light-headed.  If you are in pain, you may need to take or ask for pain medication before doing incentive spirometry. It is harder to take a deep breath if you are having pain. AFTER USE  Rest and breathe slowly and easily.  It can be helpful to keep track of a log of  your progress. Your caregiver can provide you with a simple  table to help with this. If you are using the spirometer at home, follow these instructions: [place] IF:   You are having difficultly using the spirometer.  You have trouble using the spirometer as often as instructed.  Your pain medication is not giving enough relief while using the spirometer.  You develop fever of 100.5 F (38.1 C) or higher. SEEK IMMEDIATE MEDICAL CARE IF:   You cough up bloody sputum that had not been present before.  You develop fever of 102 F (38.9 C) or greater.  You develop worsening pain at or near the incision site. MAKE SURE YOU:   Understand these instructions.  Will watch your condition.  Will get help right away if you are not doing well or get worse. Document Released: 01/08/2007 Document Revised: 11/20/2011 Document Reviewed: 03/11/2007 Exit[employer] Patient Information 2014 [employer], [place].   ________________________________________________________________________  WHAT IS A BLOOD TRANSFUSION? Blood Transfusion Information  A transfusion is the replacement of blood or some of its parts. Blood is made up of multiple cells which provide different functions.  Red blood cells carry oxygen and are used for blood loss replacement.  White blood cells fight against infection.  Platelets control bleeding.  Plasma helps clot blood.  Other blood products are available for specialized needs, such as hemophilia or other clotting disorders. BEFORE THE TRANSFUSION  Who gives blood for transfusions?   Healthy volunteers who are fully evaluated to make sure their blood is safe. This is blood bank blood. Transfusion therapy is the safest it has ever been in the practice of medicine. Before blood is taken from a donor, a complete history is taken to make sure that person has no history of diseases nor engages in risky social behavior (examples are intravenous drug use or sexual activity  with multiple partners). The donor's travel history is screened to minimize risk of transmitting infections, such as malaria. The donated blood is tested for signs of infectious diseases, such as HIV and hepatitis. The blood is then tested to be sure it is compatible with you in order to minimize the chance of a transfusion reaction. If you or a relative donates blood, this is often done in anticipation of surgery and is not appropriate for emergency situations. It takes many days to process the donated blood. RISKS AND COMPLICATIONS Although transfusion therapy is very safe and saves many lives, the main dangers of transfusion include:   Getting an infectious disease.  Developing a transfusion reaction. This is an allergic reaction to something in the blood you were given. Every precaution is taken to prevent this. The decision to have a blood transfusion has been considered carefully by your caregiver before blood is given. Blood is not given unless the benefits outweigh the risks. AFTER THE TRANSFUSION  Right after receiving a blood transfusion, you will usually feel much better and more energetic. This is especially true if your red blood cells have gotten low (anemic). The transfusion raises the level of the red blood cells which carry oxygen, and this usually causes an energy increase.  The nurse administering the transfusion will monitor you carefully for complications. HOME CARE INSTRUCTIONS  No special instructions are needed after a transfusion. You may find your energy is better. Speak with your caregiver about any limitations on activity for underlying diseases you may have. SEEK MEDICAL CARE IF:   Your condition is not improving after your transfusion.  You develop redness or irritation at the intravenous (IV) site. [place]  IF:  Any of the following symptoms occur over the next 12 hours:  Shaking chills.  You have a temperature by mouth above 102 F (38.9  C), not controlled by medicine.  Chest, back, or muscle pain.  People around you feel you are not acting correctly or are confused.  Shortness of breath or difficulty breathing.  Dizziness and fainting.  You get a rash or develop hives.  You have a decrease in urine output.  Your urine turns a dark color or changes to pink, red, or brown. Any of the following symptoms occur over the next 10 days:  You have a temperature by mouth above 102 F (38.9 C), not controlled by medicine.  Shortness of breath.  Weakness after normal activity.  The white part of the eye turns yellow (jaundice).  You have a decrease in the amount of urine or are urinating less often.  Your urine turns a dark color or changes to pink, red, or brown. Document Released: 08/25/2000 Document Revised: 11/20/2011 Document Reviewed: 04/13/2008 [facility] Patient Information 2014 [place], [place].  _______________________________________________________________________

## 2017-03-15 NOTE — Patient Instructions (Addendum)
Preparing for your Surgery  Plan for surgery on July 10 with Dr. Everitt Amber at [place] will be scheduled for a robotic assisted total hysterectomy, bilateral salpingo-oophorectomy, sentinel lymph node biopsy.  Pre-operative Testing -You will receive a phone call from presurgical testing at [facility] to arrange for a pre-operative testing appointment before your surgery.  This appointment normally occurs one to two weeks before your scheduled surgery.   -Bring your insurance card, copy of an advanced directive if applicable, medication list  -At that visit, you will be asked to sign a consent for a possible blood transfusion in case a transfusion becomes necessary during surgery.  The need for a blood transfusion is rare but having consent is a necessary part of your care.     -You should not be taking blood thinners or aspirin at least ten days prior to surgery unless instructed by your surgeon.  Day Before Surgery at [place] will be asked to take in a light diet the day before surgery.  Avoid carbonated beverages.  You will be advised to have nothing to eat or drink after midnight the evening before.     Eat a light diet the day before surgery.  Examples including soups, broths, toast, yogurt, mashed potatoes.  Things to avoid include carbonated beverages (fizzy beverages), raw fruits and raw vegetables, or beans.    If your bowels are filled with gas, your surgeon will have difficulty visualizing your pelvic organs which increases your surgical risks.  Your role in recovery Your role is to become active as soon as directed by your doctor, while still giving yourself time to heal.  Rest when you feel tired. You will be asked to do the following in order to speed your recovery:  - Cough and breathe deeply. This helps toclear and expand your lungs and can prevent pneumonia. You may be given a spirometer to practice deep breathing. A staff member  will show you how to use the spirometer. - Do mild physical activity. Walking or moving your legs help your circulation and body functions return to normal. A staff member will help you when you try to walk and will provide you with simple exercises. Do not try to get up or walk alone the first time. - Actively manage your pain. Managing your pain lets you move in comfort. We will ask you to rate your pain on a scale of zero to 10. It is your responsibility to tell your doctor or nurse where and how much you hurt so your pain can be treated.  Special Considerations -If you are diabetic, you may be placed on insulin after surgery to have closer control over your blood sugars to promote healing and recovery.  This does not mean that you will be discharged on insulin.  If applicable, your oral antidiabetics will be resumed when you are tolerating a solid diet.  -Your final pathology results from surgery should be available by the Friday after surgery and the results will be relayed to you when available.   Blood Transfusion Information WHAT IS A BLOOD TRANSFUSION? A transfusion is the replacement of blood or some of its parts. Blood is made up of multiple cells which provide different functions.  Red blood cells carry oxygen and are used for blood loss replacement.  White blood cells fight against infection.  Platelets control bleeding.  Plasma helps clot blood.  Other blood products are available for specialized needs, such as hemophilia or  other clotting disorders. BEFORE THE TRANSFUSION  Who gives blood for transfusions?   You may be able to donate blood to be used at a later date on yourself (autologous donation).  Relatives can be asked to donate blood. This is generally not any safer than if you have received blood from a stranger. The same precautions are taken to ensure safety when a relative's blood is donated.  Healthy volunteers who are fully evaluated to make sure their blood  is safe. This is blood bank blood. Transfusion therapy is the safest it has ever been in the practice of medicine. Before blood is taken from a donor, a complete history is taken to make sure that person has no history of diseases nor engages in risky social behavior (examples are intravenous drug use or sexual activity with multiple partners). The donor's travel history is screened to minimize risk of transmitting infections, such as malaria. The donated blood is tested for signs of infectious diseases, such as HIV and hepatitis. The blood is then tested to be sure it is compatible with you in order to minimize the chance of a transfusion reaction. If you or a relative donates blood, this is often done in anticipation of surgery and is not appropriate for emergency situations. It takes many days to process the donated blood. RISKS AND COMPLICATIONS Although transfusion therapy is very safe and saves many lives, the main dangers of transfusion include:   Getting an infectious disease.  Developing a transfusion reaction. This is an allergic reaction to something in the blood you were given. Every precaution is taken to prevent this. The decision to have a blood transfusion has been considered carefully by your caregiver before blood is given. Blood is not given unless the benefits outweigh the risks.

## 2017-03-16 ENCOUNTER — Encounter (HOSPITAL_COMMUNITY): Payer: Self-pay

## 2017-03-16 ENCOUNTER — Encounter (HOSPITAL_COMMUNITY)
Admission: RE | Admit: 2017-03-16 | Discharge: 2017-03-16 | Disposition: A | Discharge status: Home / Self Care | Payer: BLUE CROSS/BLUE SHIELD | Source: Ambulatory Visit | Attending: Gynecologic Oncology | Admitting: Gynecologic Oncology

## 2017-03-16 ENCOUNTER — Ambulatory Visit (HOSPITAL_COMMUNITY)
Admission: RE | Admit: 2017-03-16 | Discharge: 2017-03-16 | Disposition: A | Discharge status: Home / Self Care | Payer: BLUE CROSS/BLUE SHIELD | Source: Ambulatory Visit | Attending: Gynecologic Oncology | Admitting: Gynecologic Oncology

## 2017-03-16 DIAGNOSIS — Z01818 Encounter for other preprocedural examination: Secondary | ICD-10-CM | POA: Diagnosis not present

## 2017-03-16 DIAGNOSIS — C541 Malignant neoplasm of endometrium: Secondary | ICD-10-CM

## 2017-03-16 HISTORY — DX: Hypothyroidism, unspecified: E03.9

## 2017-03-16 HISTORY — DX: Anxiety disorder, unspecified: F41.9

## 2017-03-16 HISTORY — DX: Unspecified osteoarthritis, unspecified site: M19.90

## 2017-03-16 HISTORY — DX: Gastro-esophageal reflux disease without esophagitis: K21.9

## 2017-03-16 HISTORY — DX: Abnormal uterine and vaginal bleeding, unspecified: N93.9

## 2017-03-16 LAB — COMPREHENSIVE METABOLIC PANEL
ALT: 25 U/L (ref 14–54)
AST: 21 U/L (ref 15–41)
Albumin: 3.8 g/dL (ref 3.5–5.0)
Alkaline Phosphatase: 96 U/L (ref 38–126)
Anion gap: 6 (ref 5–15)
BUN: 21 mg/dL — ABNORMAL HIGH (ref 6–20)
CO2: 28 mmol/L (ref 22–32)
Calcium: 8.9 mg/dL (ref 8.9–10.3)
Chloride: 105 mmol/L (ref 101–111)
Creatinine, Ser: 1.05 mg/dL — ABNORMAL HIGH (ref 0.44–1.00)
GFR calc Af Amer: >60 mL/min (ref >60)
GFR calc non Af Amer: 59 mL/min — ABNORMAL LOW (ref >60)
Glucose, Bld: 128 mg/dL — ABNORMAL HIGH (ref 65–99)
Potassium: 4.6 mmol/L (ref 3.5–5.1)
Sodium: 139 mmol/L (ref 135–145)
Total Bilirubin: 0.5 mg/dL (ref 0.3–1.2)
Total Protein: 7.5 g/dL (ref 6.5–8.1)

## 2017-03-16 LAB — CBC
HCT: 40.6 % (ref 36.0–46.0)
Hemoglobin: 13.4 g/dL (ref 12.0–15.0)
MCH: 28.8 pg (ref 26.0–34.0)
MCHC: 33 g/dL (ref 30.0–36.0)
MCV: 87.3 fL (ref 78.0–100.0)
Platelets: 261 10*3/uL (ref 150–400)
RBC: 4.65 MIL/uL (ref 3.87–5.11)
RDW: 13.6 % (ref 11.5–15.5)
WBC: 10.7 10*3/uL — ABNORMAL HIGH (ref 4.0–10.5)

## 2017-03-16 LAB — URINALYSIS, ROUTINE W REFLEX MICROSCOPIC
Bacteria, UA: NONE SEEN
Bilirubin Urine: NEGATIVE
Glucose, UA: >=500 mg/dL — AB
Ketones, ur: NEGATIVE mg/dL
Nitrite: NEGATIVE
Protein, ur: NEGATIVE mg/dL
Specific Gravity, Urine: 1.023 (ref 1.005–1.030)
pH: 5 (ref 5.0–8.0)

## 2017-03-16 LAB — GLUCOSE, CAPILLARY: Glucose-Capillary: 135 mg/dL — ABNORMAL HIGH (ref 65–99)

## 2017-03-16 LAB — HCG, SERUM, QUALITATIVE: Preg, Serum: NEGATIVE

## 2017-03-16 NOTE — Progress Notes (Addendum)
EKG 05-10-16 EPIC CHEST XRAY 05-10-16 EPIC

## 2017-03-17 LAB — HEMOGLOBIN A1C
Hgb A1c MFr Bld: 8.8 % — ABNORMAL HIGH (ref 4.8–5.6)
Mean Plasma Glucose: 206 mg/dL

## 2017-03-17 LAB — ABO/RH: ABO/RH(D): O POS

## 2017-03-19 ENCOUNTER — Telehealth: Payer: Self-pay | Admitting: Gynecologic Oncology

## 2017-03-19 NOTE — Progress Notes (Signed)
hemaglobin A1c routed to [place] [facility] np epic inbasket

## 2017-03-19 NOTE — Telephone Encounter (Signed)
Patient informed of Hgb A1C results.  No concerns voiced.  Advised to call for any needs or concerns.

## 2017-03-20 ENCOUNTER — Ambulatory Visit (HOSPITAL_COMMUNITY): Payer: BLUE CROSS/BLUE SHIELD | Admitting: Certified Registered"

## 2017-03-20 ENCOUNTER — Telehealth: Payer: Self-pay | Admitting: *Deleted

## 2017-03-20 ENCOUNTER — Encounter (HOSPITAL_COMMUNITY): Payer: Self-pay

## 2017-03-20 ENCOUNTER — Encounter (HOSPITAL_COMMUNITY)
Admission: RE | Disposition: A | Discharge status: Home / Self Care | Payer: Self-pay | Source: Ambulatory Visit | Attending: Gynecologic Oncology

## 2017-03-20 ENCOUNTER — Ambulatory Visit (HOSPITAL_COMMUNITY)
Admission: RE | Admit: 2017-03-20 | Discharge: 2017-03-21 | Disposition: A | Discharge status: Home / Self Care | Payer: BLUE CROSS/BLUE SHIELD | Source: Ambulatory Visit | Attending: Gynecologic Oncology | Admitting: Gynecologic Oncology

## 2017-03-20 DIAGNOSIS — Z79899 Other long term (current) drug therapy: Secondary | ICD-10-CM | POA: Insufficient documentation

## 2017-03-20 DIAGNOSIS — D4989 Neoplasm of unspecified behavior of other specified sites: Secondary | ICD-10-CM | POA: Diagnosis not present

## 2017-03-20 DIAGNOSIS — F329 Major depressive disorder, single episode, unspecified: Secondary | ICD-10-CM | POA: Diagnosis not present

## 2017-03-20 DIAGNOSIS — K219 Gastro-esophageal reflux disease without esophagitis: Secondary | ICD-10-CM | POA: Diagnosis not present

## 2017-03-20 DIAGNOSIS — Z8041 Family history of malignant neoplasm of ovary: Secondary | ICD-10-CM | POA: Insufficient documentation

## 2017-03-20 DIAGNOSIS — I10 Essential (primary) hypertension: Secondary | ICD-10-CM | POA: Insufficient documentation

## 2017-03-20 DIAGNOSIS — Z9884 Bariatric surgery status: Secondary | ICD-10-CM | POA: Diagnosis not present

## 2017-03-20 DIAGNOSIS — C541 Malignant neoplasm of endometrium: Secondary | ICD-10-CM | POA: Diagnosis not present

## 2017-03-20 DIAGNOSIS — F419 Anxiety disorder, unspecified: Secondary | ICD-10-CM | POA: Insufficient documentation

## 2017-03-20 DIAGNOSIS — Z791 Long term (current) use of non-steroidal anti-inflammatories (NSAID): Secondary | ICD-10-CM | POA: Insufficient documentation

## 2017-03-20 DIAGNOSIS — Z801 Family history of malignant neoplasm of trachea, bronchus and lung: Secondary | ICD-10-CM | POA: Diagnosis not present

## 2017-03-20 DIAGNOSIS — E119 Type 2 diabetes mellitus without complications: Secondary | ICD-10-CM | POA: Diagnosis not present

## 2017-03-20 DIAGNOSIS — E039 Hypothyroidism, unspecified: Secondary | ICD-10-CM | POA: Insufficient documentation

## 2017-03-20 DIAGNOSIS — Z6841 Body Mass Index (BMI) 40.0 and over, adult: Secondary | ICD-10-CM | POA: Diagnosis not present

## 2017-03-20 DIAGNOSIS — D259 Leiomyoma of uterus, unspecified: Secondary | ICD-10-CM | POA: Diagnosis not present

## 2017-03-20 DIAGNOSIS — E7800 Pure hypercholesterolemia, unspecified: Secondary | ICD-10-CM | POA: Diagnosis not present

## 2017-03-20 DIAGNOSIS — Z7982 Long term (current) use of aspirin: Secondary | ICD-10-CM | POA: Insufficient documentation

## 2017-03-20 DIAGNOSIS — Z806 Family history of leukemia: Secondary | ICD-10-CM | POA: Diagnosis not present

## 2017-03-20 DIAGNOSIS — E669 Obesity, unspecified: Secondary | ICD-10-CM | POA: Diagnosis present

## 2017-03-20 HISTORY — PX: ROBOTIC ASSISTED TOTAL HYSTERECTOMY WITH BILATERAL SALPINGO OOPHERECTOMY: SHX6086

## 2017-03-20 LAB — GLUCOSE, CAPILLARY
Glucose-Capillary: 170 mg/dL — ABNORMAL HIGH (ref 65–99)
Glucose-Capillary: 190 mg/dL — ABNORMAL HIGH (ref 65–99)
Glucose-Capillary: 197 mg/dL — ABNORMAL HIGH (ref 65–99)
Glucose-Capillary: 216 mg/dL — ABNORMAL HIGH (ref 65–99)
Glucose-Capillary: 261 mg/dL — ABNORMAL HIGH (ref 65–99)
Glucose-Capillary: 272 mg/dL — ABNORMAL HIGH (ref 65–99)
Glucose-Capillary: 282 mg/dL — ABNORMAL HIGH (ref 65–99)

## 2017-03-20 LAB — TYPE AND SCREEN
ABO/RH(D): O POS
Antibody Screen: NEGATIVE

## 2017-03-20 SURGERY — HYSTERECTOMY, TOTAL, ROBOT-ASSISTED, LAPAROSCOPIC, WITH BILATERAL SALPINGO-OOPHORECTOMY
Anesthesia: General | Laterality: Bilateral

## 2017-03-20 MED ORDER — SUCCINYLCHOLINE CHLORIDE 200 MG/10ML IV SOSY
PREFILLED_SYRINGE | INTRAVENOUS | Status: AC
  Filled 2017-03-20: qty 10

## 2017-03-20 MED ORDER — KETOROLAC TROMETHAMINE 30 MG/ML IJ SOLN
INTRAMUSCULAR | Status: AC
  Filled 2017-03-20: qty 1

## 2017-03-20 MED ORDER — KCL IN DEXTROSE-NACL 20-5-0.45 MEQ/L-%-% IV SOLN
INTRAVENOUS | Status: DC
  Administered 2017-03-20: 18:00:00 via INTRAVENOUS
  Filled 2017-03-20 (×2): qty 1000

## 2017-03-20 MED ORDER — HYDROMORPHONE HCL-NACL 0.5-0.9 MG/ML-% IV SOSY
PREFILLED_SYRINGE | INTRAVENOUS | Status: AC
  Filled 2017-03-20: qty 1

## 2017-03-20 MED ORDER — TRAMADOL HCL 50 MG PO TABS
100.0000 mg | ORAL_TABLET | Freq: Four times a day (QID) | ORAL | Status: DC | PRN
  Administered 2017-03-21: 100 mg via ORAL
  Filled 2017-03-20: qty 2

## 2017-03-20 MED ORDER — HYDROMORPHONE HCL-NACL 0.5-0.9 MG/ML-% IV SOSY
0.2000 mg | PREFILLED_SYRINGE | INTRAVENOUS | Status: DC | PRN
  Administered 2017-03-20 (×2): 0.2 mg via INTRAVENOUS
  Filled 2017-03-20 (×2): qty 1

## 2017-03-20 MED ORDER — PHENYLEPHRINE HCL 10 MG/ML IJ SOLN
INTRAVENOUS | Status: DC | PRN
  Administered 2017-03-20: 25 ug/min via INTRAVENOUS

## 2017-03-20 MED ORDER — ROCURONIUM BROMIDE 50 MG/5ML IV SOSY
PREFILLED_SYRINGE | INTRAVENOUS | Status: AC
  Filled 2017-03-20: qty 5

## 2017-03-20 MED ORDER — INSULIN ASPART 100 UNIT/ML ~~LOC~~ SOLN
SUBCUTANEOUS | Status: AC
  Filled 2017-03-20: qty 1

## 2017-03-20 MED ORDER — SUCCINYLCHOLINE CHLORIDE 20 MG/ML IJ SOLN
INTRAMUSCULAR | Status: DC | PRN
  Administered 2017-03-20: 100 mg via INTRAVENOUS

## 2017-03-20 MED ORDER — FENTANYL CITRATE (PF) 250 MCG/5ML IJ SOLN
INTRAMUSCULAR | Status: AC
  Filled 2017-03-20: qty 5

## 2017-03-20 MED ORDER — MIDAZOLAM HCL 2 MG/2ML IJ SOLN
INTRAMUSCULAR | Status: AC
  Filled 2017-03-20: qty 2

## 2017-03-20 MED ORDER — ENOXAPARIN SODIUM 40 MG/0.4ML ~~LOC~~ SOLN
40.0000 mg | SUBCUTANEOUS | Status: AC
  Administered 2017-03-20: 40 mg via SUBCUTANEOUS
  Filled 2017-03-20: qty 0.4

## 2017-03-20 MED ORDER — SUGAMMADEX SODIUM 200 MG/2ML IV SOLN
INTRAVENOUS | Status: AC
  Filled 2017-03-20: qty 2

## 2017-03-20 MED ORDER — OXYCODONE-ACETAMINOPHEN 5-325 MG PO TABS
1.0000 | ORAL_TABLET | ORAL | Status: DC | PRN
  Administered 2017-03-20: 2 via ORAL
  Administered 2017-03-21: 1 via ORAL
  Administered 2017-03-21: 2 via ORAL
  Filled 2017-03-20: qty 2
  Filled 2017-03-20: qty 1
  Filled 2017-03-20: qty 2

## 2017-03-20 MED ORDER — GABAPENTIN 300 MG PO CAPS
600.0000 mg | ORAL_CAPSULE | Freq: Every day | ORAL | Status: AC
  Administered 2017-03-20: 600 mg via ORAL
  Filled 2017-03-20: qty 2

## 2017-03-20 MED ORDER — MIDAZOLAM HCL 2 MG/2ML IJ SOLN
INTRAMUSCULAR | Status: DC | PRN
  Administered 2017-03-20: 2 mg via INTRAVENOUS

## 2017-03-20 MED ORDER — HYDROMORPHONE HCL-NACL 0.5-0.9 MG/ML-% IV SOSY
0.2500 mg | PREFILLED_SYRINGE | INTRAVENOUS | Status: DC | PRN
  Administered 2017-03-20 (×2): 0.5 mg via INTRAVENOUS

## 2017-03-20 MED ORDER — ONDANSETRON HCL 4 MG/2ML IJ SOLN: 4.0000 mg | Freq: Four times a day (QID) | INTRAMUSCULAR | Status: DC | PRN

## 2017-03-20 MED ORDER — LACTATED RINGERS IR SOLN
Status: DC | PRN
  Administered 2017-03-20: 200 mL

## 2017-03-20 MED ORDER — KETOROLAC TROMETHAMINE 30 MG/ML IJ SOLN
INTRAMUSCULAR | Status: DC | PRN
  Administered 2017-03-20: 30 mg via INTRAVENOUS

## 2017-03-20 MED ORDER — STERILE WATER FOR INJECTION IJ SOLN
INTRAMUSCULAR | Status: AC
Start: 2017-03-20 — End: ?
  Filled 2017-03-20: qty 10

## 2017-03-20 MED ORDER — CEFAZOLIN SODIUM-DEXTROSE 2-3 GM-% IV SOLR
INTRAVENOUS | Status: DC | PRN
  Administered 2017-03-20: 2 g via INTRAVENOUS

## 2017-03-20 MED ORDER — MEPERIDINE HCL 50 MG/ML IJ SOLN: 6.2500 mg | INTRAMUSCULAR | Status: DC | PRN

## 2017-03-20 MED ORDER — FENTANYL CITRATE (PF) 250 MCG/5ML IJ SOLN
INTRAMUSCULAR | Status: DC | PRN
  Administered 2017-03-20: 150 ug via INTRAVENOUS

## 2017-03-20 MED ORDER — ONDANSETRON HCL 4 MG/2ML IJ SOLN
INTRAMUSCULAR | Status: DC | PRN
  Administered 2017-03-20: 4 mg via INTRAVENOUS

## 2017-03-20 MED ORDER — LEVOTHYROXINE SODIUM 100 MCG PO TABS
200.0000 ug | ORAL_TABLET | Freq: Every day | ORAL | Status: DC
  Administered 2017-03-21: 200 ug via ORAL
  Filled 2017-03-20: qty 2

## 2017-03-20 MED ORDER — STERILE WATER FOR INJECTION IJ SOLN
INTRAMUSCULAR | Status: DC | PRN
  Administered 2017-03-20: 3 mL

## 2017-03-20 MED ORDER — PHENYLEPHRINE HCL 10 MG/ML IJ SOLN
INTRAMUSCULAR | Status: DC | PRN
  Administered 2017-03-20: 40 ug via INTRAVENOUS
  Administered 2017-03-20: 120 ug via INTRAVENOUS
  Administered 2017-03-20 (×3): 80 ug via INTRAVENOUS

## 2017-03-20 MED ORDER — DEXAMETHASONE SODIUM PHOSPHATE 10 MG/ML IJ SOLN
INTRAMUSCULAR | Status: AC
  Filled 2017-03-20: qty 1

## 2017-03-20 MED ORDER — ASPIRIN EC 81 MG PO TBEC
81.0000 mg | DELAYED_RELEASE_TABLET | Freq: Every evening | ORAL | Status: DC
Start: 2017-03-21 — End: 2017-03-21

## 2017-03-20 MED ORDER — SODIUM CHLORIDE 0.9 % IV SOLN: INTRAVENOUS | Status: DC | PRN

## 2017-03-20 MED ORDER — ATORVASTATIN CALCIUM 10 MG PO TABS: 10.0000 mg | ORAL_TABLET | Freq: Every evening | ORAL | Status: DC

## 2017-03-20 MED ORDER — LACTATED RINGERS IV SOLN
INTRAVENOUS | Status: DC
  Administered 2017-03-20 (×2): via INTRAVENOUS

## 2017-03-20 MED ORDER — CEFAZOLIN SODIUM-DEXTROSE 2-4 GM/100ML-% IV SOLN
2.0000 g | INTRAVENOUS | Status: AC
  Administered 2017-03-20: 2 g via INTRAVENOUS
  Filled 2017-03-20: qty 100

## 2017-03-20 MED ORDER — ONDANSETRON HCL 4 MG PO TABS: 4.0000 mg | ORAL_TABLET | Freq: Four times a day (QID) | ORAL | Status: DC | PRN

## 2017-03-20 MED ORDER — ENOXAPARIN SODIUM 40 MG/0.4ML ~~LOC~~ SOLN
40.0000 mg | SUBCUTANEOUS | Status: DC
  Administered 2017-03-21: 40 mg via SUBCUTANEOUS
  Filled 2017-03-20: qty 0.4

## 2017-03-20 MED ORDER — LACTATED RINGERS IV SOLN: INTRAVENOUS | Status: DC

## 2017-03-20 MED ORDER — ONDANSETRON HCL 4 MG/2ML IJ SOLN
INTRAMUSCULAR | Status: AC
  Filled 2017-03-20: qty 2

## 2017-03-20 MED ORDER — INSULIN ASPART 100 UNIT/ML ~~LOC~~ SOLN
0.0000 [IU] | SUBCUTANEOUS | Status: AC
Start: 2017-03-20 — End: 2017-03-20
  Administered 2017-03-20 (×2): 11 [IU] via SUBCUTANEOUS

## 2017-03-20 MED ORDER — SUGAMMADEX SODIUM 200 MG/2ML IV SOLN
INTRAVENOUS | Status: DC | PRN
  Administered 2017-03-20: 200 mg via INTRAVENOUS

## 2017-03-20 MED ORDER — SENNOSIDES-DOCUSATE SODIUM 8.6-50 MG PO TABS
2.0000 | ORAL_TABLET | Freq: Every day | ORAL | Status: DC
  Administered 2017-03-20: 2 via ORAL
  Filled 2017-03-20: qty 2

## 2017-03-20 MED ORDER — DEXAMETHASONE SODIUM PHOSPHATE 10 MG/ML IJ SOLN
INTRAMUSCULAR | Status: DC | PRN
  Administered 2017-03-20: 10 mg via INTRAVENOUS

## 2017-03-20 MED ORDER — PROPOFOL 10 MG/ML IV BOLUS
INTRAVENOUS | Status: DC | PRN
  Administered 2017-03-20: 200 mg via INTRAVENOUS

## 2017-03-20 MED ORDER — INSULIN ASPART 100 UNIT/ML ~~LOC~~ SOLN
0.0000 [IU] | Freq: Three times a day (TID) | SUBCUTANEOUS | Status: DC
  Administered 2017-03-20: 8 [IU] via SUBCUTANEOUS
  Administered 2017-03-21: 4 [IU] via SUBCUTANEOUS
  Administered 2017-03-21: 3 [IU] via SUBCUTANEOUS

## 2017-03-20 MED ORDER — PROMETHAZINE HCL 25 MG/ML IJ SOLN: 6.2500 mg | INTRAMUSCULAR | Status: DC | PRN

## 2017-03-20 MED ORDER — LISINOPRIL 5 MG PO TABS: 2.5000 mg | ORAL_TABLET | Freq: Every evening | ORAL | Status: DC

## 2017-03-20 MED ORDER — INSULIN ASPART 100 UNIT/ML ~~LOC~~ SOLN
4.0000 [IU] | Freq: Once | SUBCUTANEOUS | Status: AC
  Administered 2017-03-20: 4 [IU] via SUBCUTANEOUS

## 2017-03-20 MED ORDER — VILAZODONE HCL 20 MG PO TABS
1.0000 | ORAL_TABLET | Freq: Every evening | ORAL | Status: DC
  Filled 2017-03-20: qty 1

## 2017-03-20 MED ORDER — SCOPOLAMINE 1 MG/3DAYS TD PT72
MEDICATED_PATCH | TRANSDERMAL | Status: AC
  Filled 2017-03-20: qty 1

## 2017-03-20 MED ORDER — IBUPROFEN 800 MG PO TABS: 800.0000 mg | ORAL_TABLET | Freq: Three times a day (TID) | ORAL | Status: DC | PRN

## 2017-03-20 MED ORDER — PROPOFOL 10 MG/ML IV BOLUS
INTRAVENOUS | Status: AC
  Filled 2017-03-20: qty 20

## 2017-03-20 MED ORDER — LIDOCAINE 2% (20 MG/ML) 5 ML SYRINGE
INTRAMUSCULAR | Status: AC
  Filled 2017-03-20: qty 5

## 2017-03-20 MED ORDER — LIDOCAINE HCL (CARDIAC) 20 MG/ML IV SOLN
INTRAVENOUS | Status: DC | PRN
  Administered 2017-03-20: 60 mg via INTRATRACHEAL

## 2017-03-20 MED ORDER — ROCURONIUM 10MG/ML (10ML) SYRINGE FOR MEDFUSION PUMP - OPTIME
INTRAVENOUS | Status: DC | PRN
  Administered 2017-03-20 (×2): 10 mg via INTRAVENOUS
  Administered 2017-03-20: 50 mg via INTRAVENOUS

## 2017-03-20 SURGICAL SUPPLY — 65 items
ADH SKN CLS APL DERMABOND .7 (GAUZE/BANDAGES/DRESSINGS) ×1
AGENT HMST KT MTR STRL THRMB (HEMOSTASIS)
APL ESCP 34 STRL LF DISP (HEMOSTASIS)
APPLICATOR COTTON TIP 6IN STRL (MISCELLANEOUS) ×2 IMPLANT
APPLICATOR SURGIFLO ENDO (HEMOSTASIS) IMPLANT
BAG LAPAROSCOPIC 12 15 PORT 16 (BASKET) IMPLANT
BAG RETRIEVAL 12/15 (BASKET)
BAG SPEC RTRVL LRG 6X4 10 (ENDOMECHANICALS)
CHLORAPREP W/TINT 26ML (MISCELLANEOUS) ×2 IMPLANT
COVER BACK TABLE 60X90IN (DRAPES) ×2 IMPLANT
COVER SURGICAL LIGHT HANDLE (MISCELLANEOUS) ×1 IMPLANT
COVER TIP SHEARS 8 DVNC (MISCELLANEOUS) ×1 IMPLANT
COVER TIP SHEARS 8MM DA VINCI (MISCELLANEOUS) ×1
DERMABOND ADVANCED (GAUZE/BANDAGES/DRESSINGS) ×1
DERMABOND ADVANCED .7 DNX12 (GAUZE/BANDAGES/DRESSINGS) ×1 IMPLANT
DRAPE ARM DVNC X/XI (DISPOSABLE) ×4 IMPLANT
DRAPE COLUMN DVNC XI (DISPOSABLE) ×1 IMPLANT
DRAPE DA VINCI XI ARM (DISPOSABLE) ×4
DRAPE DA VINCI XI COLUMN (DISPOSABLE) ×1
DRAPE SHEET LG 3/4 BI-LAMINATE (DRAPES) ×2 IMPLANT
DRAPE SURG IRRIG POUCH 19X23 (DRAPES) ×2 IMPLANT
ELECT REM PT RETURN 15FT ADLT (MISCELLANEOUS) ×2 IMPLANT
GLOVE BIO SURGEON STRL SZ 6 (GLOVE) ×8 IMPLANT
GLOVE BIO SURGEON STRL SZ 6.5 (GLOVE) ×4 IMPLANT
GOWN STRL REUS W/ TWL LRG LVL3 (GOWN DISPOSABLE) ×2 IMPLANT
GOWN STRL REUS W/TWL LRG LVL3 (GOWN DISPOSABLE) ×4
HOLDER FOLEY CATH W/STRAP (MISCELLANEOUS) ×2 IMPLANT
IRRIG SUCT STRYKERFLOW 2 WTIP (MISCELLANEOUS) ×2
IRRIGATION SUCT STRKRFLW 2 WTP (MISCELLANEOUS) ×1 IMPLANT
KIT BASIN OR (CUSTOM PROCEDURE TRAY) IMPLANT
KIT PROCEDURE DA VINCI SI (MISCELLANEOUS) ×1
KIT PROCEDURE DVNC SI (MISCELLANEOUS) ×1 IMPLANT
MANIPULATOR UTERINE 4.5 ZUMI (MISCELLANEOUS) ×2 IMPLANT
MARKER SKIN DUAL TIP RULER LAB (MISCELLANEOUS) ×1 IMPLANT
NDL SAFETY ECLIPSE 18X1.5 (NEEDLE) ×1 IMPLANT
NDL SPNL 18GX3.5 QUINCKE PK (NEEDLE) ×1 IMPLANT
NEEDLE HYPO 18GX1.5 SHARP (NEEDLE) ×2
NEEDLE SPNL 18GX3.5 QUINCKE PK (NEEDLE) ×2 IMPLANT
OBTURATOR OPTICAL STANDARD 8MM (TROCAR) ×1
OBTURATOR OPTICAL STND 8 DVNC (TROCAR) ×1
OBTURATOR OPTICALSTD 8 DVNC (TROCAR) ×1 IMPLANT
OCCLUDER COLPOPNEUMO (BALLOONS) ×2 IMPLANT
PACK ROBOT GYN CUSTOM WL (TRAY / TRAY PROCEDURE) ×2 IMPLANT
PAD POSITIONING PINK XL (MISCELLANEOUS) ×2 IMPLANT
PORT ACCESS TROCAR AIRSEAL 12 (TROCAR) IMPLANT
PORT ACCESS TROCAR AIRSEAL 5M (TROCAR)
POUCH SPECIMEN RETRIEVAL 10MM (ENDOMECHANICALS) IMPLANT
SEAL CANN UNIV 5-8 DVNC XI (MISCELLANEOUS) ×4 IMPLANT
SEAL XI 5MM-8MM UNIVERSAL (MISCELLANEOUS) ×4
SET TRI-LUMEN FLTR TB AIRSEAL (TUBING) ×2 IMPLANT
SHEET LAVH (DRAPES) ×1 IMPLANT
SOLUTION ELECTROLUBE (MISCELLANEOUS) ×2 IMPLANT
SURGIFLO W/THROMBIN 8M KIT (HEMOSTASIS) IMPLANT
SUT MNCRL AB 4-0 PS2 18 (SUTURE) ×4 IMPLANT
SUT VIC AB 0 CT1 27 (SUTURE) ×4
SUT VIC AB 0 CT1 27XBRD ANTBC (SUTURE) ×2 IMPLANT
SYR 10ML LL (SYRINGE) ×2 IMPLANT
SYR 50ML LL SCALE MARK (SYRINGE) ×2 IMPLANT
TOWEL OR 17X26 10 PK STRL BLUE (TOWEL DISPOSABLE) ×2 IMPLANT
TOWEL OR NON WOVEN STRL DISP B (DISPOSABLE) ×2 IMPLANT
TRAP SPECIMEN MUCOUS 40CC (MISCELLANEOUS) IMPLANT
TRAY FOLEY W/METER SILVER 16FR (SET/KITS/TRAYS/PACK) ×2 IMPLANT
TROCAR BLADELESS OPT 5 100 (ENDOMECHANICALS) ×1 IMPLANT
UNDERPAD 30X30 (UNDERPADS AND DIAPERS) ×3 IMPLANT
WATER STERILE IRR 1000ML POUR (IV SOLUTION) ×3 IMPLANT

## 2017-03-20 NOTE — Progress Notes (Signed)
Pt denying administration of 11 units of insulin per sliding scale (CBG was 261). Spoke w/Dr. Denman George and pt. Pt agreeable to 8 units of insulin now. Verbal orders from Dr. Denman George to recheck insulin in 1 hour, then again in 2 hours and to follow the pt's sliding scale for insulin. If after that, pt is doing ok, resume regular CBG monitoring/insulin.   Reported above to Nurse tech, Doretha Sou; she verbalized understanding and will report to night nurse tech.  Spoke with [place] in pharmacy re: orders above to assist in placing orders in computer. Reported to pt's night nurse, Vira Agar.

## 2017-03-20 NOTE — Anesthesia Procedure Notes (Signed)
Procedure Name: Intubation Date/Time: 03/20/2017 11:01 AM Performed by: Pilar Grammes Pre-anesthesia Checklist: Patient identified, Emergency Drugs available, Suction available, Patient being monitored and Timeout performed Patient Re-evaluated:Patient Re-evaluated prior to inductionOxygen Delivery Method: Circle system utilized Preoxygenation: Pre-oxygenation with 100% oxygen Intubation Type: IV induction Ventilation: Mask ventilation without difficulty Laryngoscope Size: Mac and 4 Grade View: Grade I Tube type: Oral Tube size: 7.5 mm Number of attempts: 1 Airway Equipment and Method: Stylet Placement Confirmation: positive ETCO2,  ETT inserted through vocal cords under direct vision,  CO2 detector and breath sounds checked- equal and bilateral Secured at: 22 cm Tube secured with: Tape Dental Injury: Teeth and Oropharynx as per pre-operative assessment  Comments: Easy mask and easy intubation.

## 2017-03-20 NOTE — Progress Notes (Signed)
4 units Regular Novolog Insulin Subcutaneous given in left arm

## 2017-03-20 NOTE — Progress Notes (Signed)
Dr. Rodman Comp notified of patient's CBG results in PACU - O.K. To go to floor

## 2017-03-20 NOTE — Transfer of Care (Signed)
Immediate Anesthesia Transfer of Care Note  Patient: Air cabin crew  Procedure(s) Performed: Procedure(s): XI ROBOTIC ASSISTED TOTAL HYSTERECTOMY WITH BILATERAL SALPINGO OOPHORECTOMY WITH SENTINAL LYMPH NODES (Bilateral)  Patient Location: PACU  Anesthesia Type:General  Level of Consciousness: sedated, drowsy and patient cooperative  Airway & Oxygen Therapy: Patient connected to face mask oxygen  Post-op Assessment: Report given to RN and Post -op Vital signs reviewed and stable  Post vital signs: stable  Last Vitals:  Vitals:   03/20/17 0913  BP: 134/74  Pulse: 85  Resp: 16  Temp: 36.8 C    Last Pain:  Vitals:   03/20/17 0945  TempSrc:   PainSc: 0-No pain         Complications: No apparent anesthesia complications

## 2017-03-20 NOTE — Op Note (Signed)
OPERATIVE NOTE 03/20/17  Surgeon: Donaciano Eva   Assistants: Dr Lahoma Crocker (an MD assistant was necessary for tissue manipulation, management of robotic instrumentation, retraction and positioning due to the complexity of the case and hospital policies).   Anesthesia: General endotracheal anesthesia  ASA Class: 3   Pre-operative Diagnosis: grade 2 endometrial cancer, morbid obesity   Post-operative Diagnosis: same  Operation: Robotic-assisted laparoscopic total hysterectomy with bilateral salpingoophorectomy, SLN mapping and biopsy, left para-aortic lymph node dissection.  Surgeon: Donaciano Eva  Assistant Surgeon: Lahoma Crocker MD  Anesthesia: GET  Urine Output: 300cc  Operative Findings:  : extreme abdominal obesity making visualization of retroperitoneum difficult and required additional retraction and time to identify structures. 8cm normal appearing uterus and ovaries.  No apparent extrauterine disease.  Estimated Blood Loss:  less than 50 mL      Total IV Fluids: 800 ml         Specimens: uterus, cervix and bilateral tubes and ovaries, left common iliac SLN, right external iiliac SLN.         Complications:  None; patient tolerated the procedure well.         Disposition: PACU - hemodynamically stable.  Procedure Details  The patient was seen in the Holding Room. The risks, benefits, complications, treatment options, and expected outcomes were discussed with the patient.  The patient concurred with the proposed plan, giving informed consent.  The site of surgery properly noted/marked. The patient was identified as Air cabin crew and the procedure verified as a Robotic-assisted hysterectomy with bilateral salpingo oophorectomy. 1mg  total of ICG was injected into the cervical stroma at 2 and 9 o'clock at a 48mm depth (concentration 0..5mg /ml).  A Time Out was held and the above information confirmed.  After induction of anesthesia, the  patient was draped and prepped in the usual sterile manner. Pt was placed in supine position after anesthesia and draped and prepped in the usual sterile manner. The abdominal drape was placed after the CholoraPrep had been allowed to dry for 3 minutes.  Her arms were tucked to her side with all appropriate precautions.  The shoulders were stabilized with padded shoulder blocks applied to the acromium processes.  The patient was placed in the semi-lithotomy position in [place].  The perineum was prepped with Betadine. The patient was then prepped. Foley catheter was placed.  A sterile speculum was placed in the vagina.  The cervix was grasped with a single-tooth tenaculum and dilated with Kennon Rounds dilators.  The ZUMI uterine manipulator with a medium colpotomizer ring was placed without difficulty.  A pneum occluder balloon was placed over the manipulator.  OG tube placement was confirmed and to suction.   Next, a 5 mm skin incision was made 1 cm below the subcostal margin in the midclavicular line.  The 5 mm Optiview port and scope was used for direct entry.  Opening pressure was under 10 mm CO2.  The abdomen was insufflated and the findings were noted as above.   At this point and all points during the procedure, the patient's intra-abdominal pressure did not exceed 15 mmHg. Next, a 10 mm skin incision was made a handspan above the umbilicus and a right and left port was placed about 10 cm lateral to the robot port on the right and left side.  A fourth arm was placed in the left lower quadrant 2 cm above and superior and medial to the anterior superior iliac spine.  All ports were placed under direct  visualization.  The patient was placed in steep Trendelenburg.  Bowel was folded away into the upper abdomen.  The robot was docked in the normal manner.  The right and left peritoneum were opened parallel to the IP ligament to open the retroperitoneal spaces bilaterally. The SLN mapping was performed in  bilateral pelvic basins. The para rectal and paravesical spaces were opened up. Lymphatic channels were identified travelling to the following visualized sentinel lymph node's: right external iliac and left common iliac. To extract the left common iliac (paraaortic) node, an extended incision was made on the paracolic gutter peritoneum and the sigmoid colon was reflected medially, the ureter swept medially off of the underlying node and retraction maintained to retract the ureter with the 4th arm. These SLN's were separated from their surrounding lymphatic tissue, removed and sent for permanent pathology.  The hysterectomy was started after the round ligament on the right side was incised and the retroperitoneum was entered and the pararectal space was developed.  The ureter was noted to be on the medial leaf of the broad ligament.  The peritoneum above the ureter was incised and stretched and the infundibulopelvic ligament was skeletonized, cauterized and cut.  The posterior peritoneum was taken down to the level of the KOH ring.  The anterior peritoneum was also taken down.  The bladder flap was created to the level of the KOH ring.  The uterine artery on the right side was skeletonized, cauterized and cut in the normal manner.  A similar procedure was performed on the left.  The colpotomy was made and the uterus, cervix, bilateral ovaries and tubes were amputated and delivered through the vagina.  Pedicles were inspected and excellent hemostasis was achieved.    The colpotomy at the vaginal cuff was closed with Vicryl on a CT1 needle in a figure of 8 manner.  Irrigation was used and excellent hemostasis was achieved.  At this point in the procedure was completed.  Robotic instruments were removed under direct visulaization.  The robot was undocked. The 10 mm ports were closed with Vicryl on a UR-5 needle and the fascia was closed with 0 Vicryl on a UR-5 needle.  The skin was closed with 4-0 Vicryl in a  subcuticular manner.  Dermabond was applied.  Sponge, lap and needle counts correct x 2.  The patient was taken to the recovery room in stable condition.  The vagina was swabbed with  minimal bleeding noted.   All instrument and needle counts were correct x  3.   The patient was transferred to the recovery room in a stable condition.  Donaciano Eva, MD

## 2017-03-20 NOTE — Progress Notes (Signed)
Dr. Smith Robert notified of patient's CBG results in PACU-216- orders given.

## 2017-03-20 NOTE — Interval H&P Note (Signed)
History and Physical Interval Note:  03/20/2017 9:37 AM  April Scott  has presented today for surgery, with the diagnosis of endometrial cancer  The various methods of treatment have been discussed with the patient and family. After consideration of risks, benefits and other options for treatment, the patient has consented to  Procedure(s): XI ROBOTIC ASSISTED TOTAL HYSTERECTOMY WITH BILATERAL SALPINGO OOPHORECTOMY WITH SENTINAL LYMPH NODES (Bilateral) as a surgical intervention .  The patient's history has been reviewed, patient examined, no change in status, stable for surgery.  I have reviewed the patient's chart and labs.  Questions were answered to the patient's satisfaction.     Donaciano Eva

## 2017-03-20 NOTE — Anesthesia Preprocedure Evaluation (Addendum)
Anesthesia Evaluation  Patient identified by MRN, date of birth, ID band Patient awake    Reviewed: Allergy & Precautions, NPO status , Patient's Chart, lab work & pertinent test results  Airway Mallampati: III  TM Distance: >3 FB Neck ROM: Full    Dental  (+) Teeth Intact, Dental Advisory Given   Pulmonary neg pulmonary ROS,    breath sounds clear to auscultation       Cardiovascular hypertension, Pt. on medications negative cardio ROS   Rhythm:Regular Rate:Normal     Neuro/Psych PSYCHIATRIC DISORDERS Anxiety Depression negative neurological ROS     GI/Hepatic Neg liver ROS, GERD  ,  Endo/Other  diabetes, Type 2Hypothyroidism   Renal/GU      Musculoskeletal  (+) Arthritis , Osteoarthritis,    Abdominal   Peds  Hematology negative hematology ROS (+)   Anesthesia Other Findings   Reproductive/Obstetrics negative OB ROS                            Anesthesia Physical Anesthesia Plan  ASA: III  Anesthesia Plan: General   Post-op Pain Management:    Induction: Intravenous  PONV Risk Score and Plan: 4 or greater and Ondansetron, Dexamethasone, Propofol, Midazolam and Scopolamine patch - Pre-op  Airway Management Planned: Oral ETT  Additional Equipment:   Intra-op Plan:   Post-operative Plan: Extubation in OR  Informed Consent: I have reviewed the patients History and Physical, chart, labs and discussed the procedure including the risks, benefits and alternatives for the proposed anesthesia with the patient or authorized representative who has indicated his/her understanding and acceptance.   Dental advisory given  Plan Discussed with: CRNA  Anesthesia Plan Comments:         Anesthesia Quick Evaluation

## 2017-03-20 NOTE — Progress Notes (Signed)
CBG rechecked- results 197

## 2017-03-20 NOTE — H&P (View-Only) (Signed)
Consult Note: Gyn-Onc  Consult was requested by Dr. Alden Hipp for the evaluation of April Scott 56 y.o. female  CC:  Chief Complaint  Patient presents with  . Endometrial Cancer    Assessment/Plan:  Ms. April Scott is a 56 y.o.  with grade 2 endometrioid endometrial cancer.  Recommendation was made for robotic hysterectomy bilateral sentinel lymph node dissection possible lymph node dissection possible open procedure I extensively reviewed the risks of robotic hysterectomy and possible lymph node dissection of infection, bleeding, damage to nearby organs including bowel, bladder, vessels, nerves, and ureters. We discussed postoperative risks including infection, PE/ DVT, and lymphedema. We reviewed possible need for conversion to open laparotomy, need for possible blood transfusion. I discussed positioning during surgery of trendelenberg and risks of minor facial swelling and care we take in preoperative positioning. We also reviewed possible need for further treatment such as radiation or chemotherapy based on final pathology. Expected postoperative recovery was also discussed.  April Scott and her husband are aware that poor glucose control than obesity markedly elevated the morbidity of this procedure.  The procedure is planned for 03/20/2017 with Dr. Denman George.  Will check hemoglobin A1c. April Scott was counseled to optimize her glucose control from today until at least 2 weeks after surgery.   HPI: Ms. April Scott is a 56 y.o.  gravida 2 para 0 reports intermittent vaginal spotting for approximately 6 months. The longest continuous flow lasted for approximately 3 weeks. Her last normal menstrual period was approximately one half years ago.  An endometrial biopsy collected on 06/2 27 2018 is notable for moderately differentiated endometrioid adenocarcinoma  (uterus sounded to 9 cm).  April Scott reports intermittent vaginal spotting since that  procedure. Her history is notable for sending type 2 diabetes mellitus with a hemoglobin A1c of 7.2. The last time she checked a fasting blood sugar was 3 weeks ago in the value was 120.  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 Her weight loss stalled out at 72 pounds.   Review of Systems:  Constitutional  Feels well,  Cardiovascular  No chest pain, shortness of breath, or edema  Pulmonary  No cough or wheeze.  Gastro Intestinal  No nausea, vomitting, or diarrhoea. No bright red blood per rectum, no abdominal pain, change in bowel movement, or constipation.  Genito Urinary  No frequency, urgency, dysuria, Vaginal spotting  Musculo Skeletal  No myalgia, arthralgia, joint swelling or pain  Neurologic  No weakness, numbness, change in gait,  Psychology  No depression, anxiety, insomnia.    Current Meds:  Outpatient Encounter Prescriptions as of 03/15/2017  Medication Sig  . aspirin EC 81 MG tablet Take 81 mg by mouth daily.  April Scott atorvastatin (LIPITOR) 10 MG tablet Take 10 mg by mouth daily.   . empagliflozin (JARDIANCE) 10 MG TABS tablet Take 10 mg by mouth daily.  . hydrOXYzine (ATARAX/VISTARIL) 10 MG tablet Take 1 tablet (10 mg total) by mouth every 6 (six) hours as needed for anxiety.  April Scott ibuprofen (ADVIL,MOTRIN) 200 MG tablet Take 800-1,000 mg by mouth 2 (two) times daily as needed.  . JENTADUETO XR 01-999 MG TB24   . levothyroxine (SYNTHROID) 200 MCG tablet Synthroid 200 mcg tablet  . LINAGLIPTIN-METFORMIN HCL PO Take 1 tablet by mouth daily.  April Scott lisinopril (PRINIVIL,ZESTRIL) 2.5 MG tablet   . VIIBRYD 20 MG TABS   . [DISCONTINUED] aspirin (BAYER ASPIRIN) 325 MG tablet Bayer Aspirin  . HYDROcodone-acetaminophen (NORCO/VICODIN) 5-325  MG tablet hydrocodone 5 mg-acetaminophen 325 mg tablet  . [DISCONTINUED] levothyroxine (SYNTHROID, LEVOTHROID) 25 MCG tablet Take 25 mcg by mouth daily before breakfast.    No  facility-administered encounter medications on file as of 03/15/2017.     Allergy: No Known Allergies  Social Hx:   Social History   Social History  . Marital status: Divorced    Spouse name: N/A  . Number of children: N/A  . Years of education: N/A   Occupational History  . Not on file.   Social History Main Topics  . Smoking status: Never Smoker  . Smokeless tobacco: Never Used  . Alcohol use No  . Drug use: No  . Sexual activity: Not on file   Other Topics Concern  . Not on file   Social History Narrative  . No narrative on file    Past Surgical Hx:  Past Surgical History:  Procedure Laterality Date  . LAPAROSCOPIC GASTRIC SLEEVE RESECTION    . spleen repair      Past Medical Hx:  Past Medical History:  Diagnosis Date  . Depression   . Diabetes mellitus without complication ([place])   . Hypercholesteremia   . Renal disorder   . Thyroid disease   Mammogram 2017 within normal limits colonoscopy in 2017 removal of 2 benign polyps colonoscopy 5 years prior to that also associated removal of polyps  Past Gynecological History:   Gravida 2 para 0 last normal menstrual period 1-1/2 years ago menarche at age 29 irregular menses throughout adulthood   Family Hx: Maternal aunt with ovarian cancer diagnosis 65 years of age father with leukemia in early adulthood and lung cancer in later adulthood  Vitals:  Blood pressure 136/76, pulse 86, temperature 98.4 F (36.9 C), resp. rate 20, height 5\' 2"  (1.575 m), weight 238 lb 6.4 oz (108.1 kg), SpO2 97 %. Body mass index is 43.6 kg/m.   Physical Exam: WD in NAD Neck  Supple NROM, without any enlargements.  Lymph Node Survey No cervical supraclavicular or inguinal adenopathy Cardiovascular  Pulse normal rate, regularity and rhythm.  Lungs  Clear to auscultation bilaterally, without wheezes/crackles/rhonchi. Good air movement.  Psychiatry  Alert and oriented appropriate mood affect speech and reasoning. Abdomen   Normoactive bowel sounds, abdomen soft, non-tender. Back No CVA tenderness Genito Urinary  Vulva/vagina: Normal external female genitalia.  No lesions.Trace bleeding.  Bladder/urethra:  No lesions or masses  Vagina: Well estrogenized trace blood within the vaginal vault  Cervix: Normal appearing, nulliparous well supported no lesions.  Uterus: Mobile, no parametrial involvement or nodularity. Unable to assess size because of body habitus   Adnexa: Unable to assess because of body habitus Rectal  Good tone, no masses no cul de sac nodularity.  Extremities  No bilateral cyanosis, clubbing or edema.   Janie Morning, MD, PhD 03/15/2017, 3:47 PM

## 2017-03-20 NOTE — Telephone Encounter (Signed)
Per staff message I have scheduled post op appt, patient will receive the appt on the discharge summary

## 2017-03-21 DIAGNOSIS — C541 Malignant neoplasm of endometrium: Secondary | ICD-10-CM | POA: Diagnosis not present

## 2017-03-21 LAB — BASIC METABOLIC PANEL
Anion gap: 8 (ref 5–15)
BUN: 23 mg/dL — ABNORMAL HIGH (ref 6–20)
CO2: 27 mmol/L (ref 22–32)
Calcium: 8.7 mg/dL — ABNORMAL LOW (ref 8.9–10.3)
Chloride: 104 mmol/L (ref 101–111)
Creatinine, Ser: 1.14 mg/dL — ABNORMAL HIGH (ref 0.44–1.00)
GFR calc Af Amer: >60 mL/min (ref >60)
GFR calc non Af Amer: 54 mL/min — ABNORMAL LOW (ref >60)
Glucose, Bld: 144 mg/dL — ABNORMAL HIGH (ref 65–99)
Potassium: 5 mmol/L (ref 3.5–5.1)
Sodium: 139 mmol/L (ref 135–145)

## 2017-03-21 LAB — CBC
HCT: 38.3 % (ref 36.0–46.0)
Hemoglobin: 12.4 g/dL (ref 12.0–15.0)
MCH: 28.8 pg (ref 26.0–34.0)
MCHC: 32.4 g/dL (ref 30.0–36.0)
MCV: 88.9 fL (ref 78.0–100.0)
Platelets: 259 10*3/uL (ref 150–400)
RBC: 4.31 MIL/uL (ref 3.87–5.11)
RDW: 13.5 % (ref 11.5–15.5)
WBC: 15.7 10*3/uL — ABNORMAL HIGH (ref 4.0–10.5)

## 2017-03-21 LAB — GLUCOSE, CAPILLARY
Glucose-Capillary: 140 mg/dL — ABNORMAL HIGH (ref 65–99)
Glucose-Capillary: 153 mg/dL — ABNORMAL HIGH (ref 65–99)
Glucose-Capillary: 168 mg/dL — ABNORMAL HIGH (ref 65–99)

## 2017-03-21 MED ORDER — OXYCODONE-ACETAMINOPHEN 5-325 MG PO TABS: 1.0000 | ORAL_TABLET | ORAL | 0 refills | Status: DC | PRN

## 2017-03-21 NOTE — Discharge Summary (Signed)
Physician Discharge Summary  Patient ID: April Scott MRN: [id number] DOB/AGE: 1960-12-11 56 y.o.  Admit date: 03/20/2017 Discharge date: 03/21/2017  Admission Diagnoses: Endometrial cancer [facility])  Discharge Diagnoses:  Principal Problem:   Endometrial cancer ([place]) Active Problems:   Diabetes mellitus ([place])   Obesity   Discharged Condition:  The patient is in good condition and stable for discharge.    Hospital Course: On 03/20/2017, the patient underwent the following: Procedure(s): XI ROBOTIC ASSISTED TOTAL HYSTERECTOMY WITH BILATERAL SALPINGO OOPHORECTOMY WITH SENTINAL LYMPH NODES.  The postoperative course was uneventful.  She was discharged to home on postoperative day 1 tolerating a regular diet, passing flatus, voiding, ambulating, pain controlled.  Consults: None  Significant Diagnostic Studies: None  Treatments: surgery: see above  Discharge Exam: Blood pressure (!) 113/58, pulse 79, temperature 98.4 F (36.9 C), temperature source Oral, resp. rate 18, height 5\' 2"  (1.575 m), weight 238 lb (108 kg), last menstrual period 02/19/2017, SpO2 97 %. General appearance: alert, cooperative and no distress Resp: clear to auscultation bilaterally Cardio: regular rate and rhythm, S1, S2 normal, no murmur, click, rub or gallop GI: soft, non-tender; bowel sounds normal; no masses,  no organomegaly and abdomen morbidly obese Extremities: extremities normal, atraumatic, no cyanosis or edema Incision/Wound: Lap sites to the abdomen with dermabond without erythema or drainage  Disposition: 01-Home or Self Care  Discharge Instructions    Call MD for:  difficulty breathing, headache or visual disturbances    Complete by:  As directed    Call MD for:  extreme fatigue    Complete by:  As directed    Call MD for:  hives    Complete by:  As directed    Call MD for:  persistant dizziness or light-headedness    Complete by:  As directed    Call MD for:  persistant nausea and  vomiting    Complete by:  As directed    Call MD for:  redness, tenderness, or signs of infection (pain, swelling, redness, odor or green/yellow discharge around incision site)    Complete by:  As directed    Call MD for:  severe uncontrolled pain    Complete by:  As directed    Call MD for:  temperature >100.4    Complete by:  As directed    Diet - low sodium heart healthy    Complete by:  As directed    Driving Restrictions    Complete by:  As directed    No driving for 1 week.  Do not take narcotics and drive.   Increase activity slowly    Complete by:  As directed    Lifting restrictions    Complete by:  As directed    No lifting greater than 10 lbs.   Sexual Activity Restrictions    Complete by:  As directed    No sexual activity, nothing in the vagina, for 8 weeks.     Allergies as of 03/21/2017   No Known Allergies     Medication List    TAKE these medications   acetaminophen 500 MG tablet Commonly known as:  TYLENOL Take 1,000 mg by mouth every 6 (six) hours as needed.   aspirin EC 81 MG tablet Take 81 mg by mouth every evening.   atorvastatin 10 MG tablet Commonly known as:  LIPITOR Take 10 mg by mouth every evening.   HAIR/SKIN/NAILS PO Take 1 tablet by mouth every evening.   hydrOXYzine 10 MG tablet Commonly known as:  ATARAX/VISTARIL Take 1 tablet (10 mg total) by mouth every 6 (six) hours as needed for anxiety.   ibuprofen 200 MG tablet Commonly known as:  ADVIL,MOTRIN Take 600-800 mg by mouth 2 (two) times daily as needed for moderate pain.   JARDIANCE 10 MG Tabs tablet Generic drug:  empagliflozin Take 10 mg by mouth every evening.   JENTADUETO XR 01-999 MG Tb24 Generic drug:  Linagliptin-Metformin HCl ER Take 1 tablet by mouth every evening.   lisinopril 2.5 MG tablet Commonly known as:  PRINIVIL,ZESTRIL Take 2.5 mg by mouth every evening.   oxyCODONE-acetaminophen 5-325 MG tablet Commonly known as:  PERCOCET/ROXICET Take 1-2 tablets by  mouth every 4 (four) hours as needed (moderate to severe pain).   SYNTHROID 200 MCG tablet Generic drug:  levothyroxine Take 1 tablet by mouth every evening   VIIBRYD 20 MG Tabs Generic drug:  Vilazodone HCl Take 1 tablet by mouth every evening.        Greater than thirty minutes were spend for face to face discharge instructions and discharge orders/summary in EPIC.   Signed: Maiyah Goyne DEAL 03/21/2017, 12:06 PM

## 2017-03-21 NOTE — Anesthesia Postprocedure Evaluation (Signed)
Anesthesia Post Note  Patient: Air cabin crew  Procedure(s) Performed: Procedure(s) (LRB): XI ROBOTIC ASSISTED TOTAL HYSTERECTOMY WITH BILATERAL SALPINGO OOPHORECTOMY WITH SENTINAL LYMPH NODES (Bilateral)     Patient location during evaluation: PACU Anesthesia Type: General Level of consciousness: awake and alert Pain management: pain level controlled Vital Signs Assessment: post-procedure vital signs reviewed and stable Respiratory status: spontaneous breathing, nonlabored ventilation, respiratory function stable and patient connected to nasal cannula oxygen Cardiovascular status: blood pressure returned to baseline and stable Postop Assessment: no signs of nausea or vomiting Anesthetic complications: no    Last Vitals:  Vitals:   03/21/17 0158 03/21/17 0516  BP: 115/66 113/81  Pulse: (!) 56 61  Resp: 16 16  Temp: 36.7 C 36.8 C    Last Pain:  Vitals:   03/21/17 0516  TempSrc: Oral  PainSc:                  Effie Berkshire

## 2017-03-21 NOTE — Progress Notes (Signed)
Discharge instructions reviewed with patient utilizing teach back method no questions at this time. Patient discharged to home with  Husband.

## 2017-03-21 NOTE — Discharge Instructions (Addendum)
03/20/2017  Return to work: 4 weeks  Activity: 1. Be up and out of the bed during the day.  Take a nap if needed.  You may walk up steps but be careful and use the hand rail.  Stair climbing will tire you more than you think, you may need to stop part way and rest.   2. No lifting or straining for 6 weeks.  3. No driving for 1 weeks.  Do Not drive if you are taking narcotic pain medicine.  4. Shower daily.  Use soap and water on your incision and pat dry; don't rub.   5. No sexual activity and nothing in the vagina for 8 weeks.  Medications:  - Take ibuprofen and tylenol first line for pain control. Take these regularly (every 6 hours) to decrease the build up of pain.  - If necessary, for severe pain not relieved by ibuprofen, take percocet.  - While taking percocet you should take sennakot every night to reduce the likelihood of constipation. If this causes diarrhea, stop its use.  Diet: 1. Low sodium Heart Healthy Diet is recommended.  2. It is safe to use a laxative if you have difficulty moving your bowels.   Wound Care: 1. Keep clean and dry.  Shower daily.  Reasons to call the Doctor:   Fever - Oral temperature greater than 100.4 degrees Fahrenheit  Foul-smelling vaginal discharge  Difficulty urinating  Nausea and vomiting  Increased pain at the site of the incision that is unrelieved with pain medicine.  Difficulty breathing with or without chest pain  New calf pain especially if only on one side  Sudden, continuing increased vaginal bleeding with or without clots.   Follow-up: 1. See Everitt Amber in 3 weeks.  Contacts: For questions or concerns you should contact:  Dr. Everitt Amber at [phone] After hours and on week-ends call [phone] and ask to speak to the physician on call for Gynecologic Oncology  Acetaminophen; Oxycodone tablets What is this medicine? ACETAMINOPHEN; OXYCODONE (a set a MEE noe fen; ox i KOE done) is a pain reliever. It is  used to treat moderate to severe pain. This medicine may be used for other purposes; ask your health care provider or pharmacist if you have questions. COMMON BRAND NAME(S): Endocet, Magnacet, Narvox, Percocet, Perloxx, Primalev, Primlev, Roxicet, Xolox What should I tell my health care provider before I take this medicine? They need to know if you have any of these conditions: -brain tumor -Crohn's disease, inflammatory bowel disease, or ulcerative colitis -drug abuse or addiction -head injury -heart or circulation problems -if you often drink alcohol -kidney disease or problems going to the bathroom -liver disease -lung disease, asthma, or breathing problems -an unusual or allergic reaction to acetaminophen, oxycodone, other opioid analgesics, other medicines, foods, dyes, or preservatives -pregnant or trying to get pregnant -breast-feeding How should I use this medicine? Take this medicine by mouth with a full glass of water. Follow the directions on the prescription label. You can take it with or without food. If it upsets your stomach, take it with food. Take your medicine at regular intervals. Do not take it more often than directed. A special MedGuide will be given to you by the pharmacist with each prescription and refill. Be sure to read this information carefully each time. Talk to your pediatrician regarding the use of this medicine in children. Special care may be needed. Overdosage: If you think you have taken too much of this medicine contact  a poison control center or emergency room at once. NOTE: This medicine is only for you. Do not share this medicine with others. What if I miss a dose? If you miss a dose, take it as soon as you can. If it is almost time for your next dose, take only that dose. Do not take double or extra doses. What may interact with this medicine? This medicine may interact with the following medications: -alcohol -antihistamines for allergy, cough and  cold -antiviral medicines for HIV or AIDS -atropine -certain antibiotics like clarithromycin, erythromycin, linezolid, rifampin -certain medicines for anxiety or sleep -certain medicines for bladder problems like oxybutynin, tolterodine -certain medicines for depression like amitriptyline, fluoxetine, sertraline -certain medicines for fungal infections like ketoconazole, itraconazole, voriconazole -certain medicines for migraine headache like almotriptan, eletriptan, frovatriptan, naratriptan, rizatriptan, sumatriptan, zolmitriptan -certain medicines for nausea or vomiting like dolasetron, ondansetron, palonosetron -certain medicines for Parkinson's disease like benztropine, trihexyphenidyl -certain medicines for seizures like phenobarbital, phenytoin, primidone -certain medicines for stomach problems like dicyclomine, hyoscyamine -certain medicines for travel sickness like scopolamine -diuretics -general anesthetics like halothane, isoflurane, methoxyflurane, propofol -ipratropium -local anesthetics like lidocaine, pramoxine, tetracaine -MAOIs like Carbex, Eldepryl, Marplan, Nardil, and Parnate -medicines that relax muscles for surgery -methylene blue -nilotinib -other medicines with acetaminophen -other narcotic medicines for pain or cough -phenothiazines like chlorpromazine, mesoridazine, prochlorperazine, thioridazine This list may not describe all possible interactions. Give your health care provider a list of all the medicines, herbs, non-prescription drugs, or dietary supplements you use. Also tell them if you smoke, drink alcohol, or use illegal drugs. Some items may interact with your medicine. What should I watch for while using this medicine? Tell your doctor or health care professional if your pain does not go away, if it gets worse, or if you have new or a different type of pain. You may develop tolerance to the medicine. Tolerance means that you will need a higher dose of the  medication for pain relief. Tolerance is normal and is expected if you take this medicine for a long time. Do not suddenly stop taking your medicine because you may develop a severe reaction. Your body becomes used to the medicine. This does NOT mean you are addicted. Addiction is a behavior related to getting and using a drug for a non-medical reason. If you have pain, you have a medical reason to take pain medicine. Your doctor will tell you how much medicine to take. If your doctor wants you to stop the medicine, the dose will be slowly lowered over time to avoid any side effects. There are different types of narcotic medicines (opiates). If you take more than one type at the same time or if you are taking another medicine that also causes drowsiness, you may have more side effects. Give your health care provider a list of all medicines you use. Your doctor will tell you how much medicine to take. Do not take more medicine than directed. Call emergency for help if you have problems breathing or unusual sleepiness. Do not take other medicines that contain acetaminophen with this medicine. Always read labels carefully. If you have questions, ask your doctor or pharmacist. If you take too much acetaminophen get medical help right away. Too much acetaminophen can be very dangerous and cause liver damage. Even if you do not have symptoms, it is important to get help right away. You may get drowsy or dizzy. Do not drive, use machinery, or do anything that needs mental alertness until you know  how this medicine affects you. Do not stand or sit up quickly, especially if you are an older patient. This reduces the risk of dizzy or fainting spells. Alcohol may interfere with the effect of this medicine. Avoid alcoholic drinks. The medicine will cause constipation. Try to have a bowel movement at least every 2 to 3 days. If you do not have a bowel movement for 3 days, call your doctor or health care professional. Your  mouth may get dry. Chewing sugarless gum or sucking hard candy, and drinking plenty or water may help. Contact your doctor if the problem does not go away or is severe. What side effects may I notice from receiving this medicine? Side effects that you should report to your doctor or health care professional as soon as possible: -allergic reactions like skin rash, itching or hives, swelling of the face, lips, or tongue -breathing problems -confusion -redness, blistering, peeling or loosening of the skin, including inside the mouth -signs and symptoms of liver injury like dark yellow or brown urine; general ill feeling or flu-like symptoms; light-colored stools; loss of appetite; nausea; right upper belly pain; unusually weak or tired; yellowing of the eyes or skin -signs and symptoms of low blood pressure like dizziness; feeling faint or lightheaded, falls; unusually weak or tired -trouble passing urine or change in the amount of urine Side effects that usually do not require medical attention (report to your doctor or health care professional if they continue or are bothersome): -constipation -dry mouth -nausea, vomiting -tiredness This list may not describe all possible side effects. Call your doctor for medical advice about side effects. You may report side effects to FDA at 1-800-FDA-1088. Where should I keep my medicine? Keep out of the reach of children. This medicine can be abused. Keep your medicine in a safe place to protect it from theft. Do not share this medicine with anyone. Selling or giving away this medicine is dangerous and against the law. This medicine may cause accidental overdose and death if it taken by other adults, children, or pets. Mix any unused medicine with a substance like cat litter or coffee grounds. Then throw the medicine away in a sealed container like a sealed bag or a coffee can with a lid. Do not use the medicine after the expiration date. Store at room  temperature between 20 and 25 degrees C (68 and 77 degrees F). NOTE: This sheet is a summary. It may not cover all possible information. If you have questions about this medicine, talk to your doctor, pharmacist, or health care provider.  2018 Elsevier/Gold Standard (2015-05-24 21:48:12)

## 2017-03-27 ENCOUNTER — Telehealth: Payer: Self-pay | Admitting: Gynecologic Oncology

## 2017-03-27 NOTE — Telephone Encounter (Signed)
Informed patient of pathology results of stage IA endometrial cancer with isolated tumor cells in 1 SLN.  Discussed that typical recommendation is for no further treatment. Could consider progesterone therapy or, most aggressive intervention would be chemotherapy.  Donaciano Eva, MD

## 2017-04-03 ENCOUNTER — Encounter: Payer: Self-pay | Admitting: Gynecologic Oncology

## 2017-04-03 DIAGNOSIS — C541 Malignant neoplasm of endometrium: Secondary | ICD-10-CM | POA: Diagnosis not present

## 2017-04-03 DIAGNOSIS — E11621 Type 2 diabetes mellitus with foot ulcer: Secondary | ICD-10-CM | POA: Diagnosis not present

## 2017-04-03 DIAGNOSIS — E1165 Type 2 diabetes mellitus with hyperglycemia: Secondary | ICD-10-CM | POA: Diagnosis not present

## 2017-04-03 DIAGNOSIS — R740 Nonspecific elevation of levels of transaminase and lactic acid dehydrogenase [LDH]: Secondary | ICD-10-CM | POA: Diagnosis not present

## 2017-04-03 NOTE — Progress Notes (Addendum)
Gynecologic Oncology Multi-Disciplinary Disposition Conference Note  Date of the Conference: April 02, 2017  Patient Name: April Scott  Referring Provider: Dr. Deatra Ina Primary GYN Oncologist: Dr. Everitt Amber  Stage/Disposition:  Stage IA endometrial cancer with isolated tumor cells in 1 SLN.  Disposition is to close observation.  MSI pending.  Plan for CT scan of chest, abdomen, and pelvis.  May consider progesterone therapy.   This Multidisciplinary conference took place involving physicians from [facility], [facility], Radiation Oncology, Pathology, Radiology along with the Gynecologic Oncology Nurse Practitioner and RN.  Comprehensive assessment of the patient's malignancy, staging, need for surgery, chemotherapy, radiation therapy, and need for further testing were reviewed. Supportive measures, both inpatient and following discharge were also discussed. The recommended plan of care is documented. Greater than 35 minutes were spent correlating and coordinating this patient's care.

## 2017-04-06 ENCOUNTER — Telehealth: Payer: Self-pay

## 2017-04-06 NOTE — Telephone Encounter (Addendum)
April Scott stated that she needs a note stating that she was not able to work as of 03-12-17 not 03-20-17 as noted on the STD form for the [employer]. She was not able to go to work from the emotional stress after receiving her diagnosis of cancer on 03-12-17.  Stated that the date of disability is related to the date of Surgery.  Pt very upset that this request  cannot be accommodated. Told her that she would need to speak to Dr. Denman George at her post op visit on 04-18-17.

## 2017-04-18 ENCOUNTER — Ambulatory Visit: Payer: BLUE CROSS/BLUE SHIELD | Admitting: Gynecologic Oncology

## 2017-04-23 ENCOUNTER — Ambulatory Visit: Payer: BLUE CROSS/BLUE SHIELD | Attending: Gynecologic Oncology | Admitting: Gynecologic Oncology

## 2017-04-23 ENCOUNTER — Encounter: Payer: Self-pay | Admitting: Gynecologic Oncology

## 2017-04-23 VITALS — BP 129/73 | HR 88 | Temp 98.4°F | Resp 16 | Wt 236.8 lb

## 2017-04-23 DIAGNOSIS — Z801 Family history of malignant neoplasm of trachea, bronchus and lung: Secondary | ICD-10-CM | POA: Diagnosis not present

## 2017-04-23 DIAGNOSIS — C541 Malignant neoplasm of endometrium: Secondary | ICD-10-CM | POA: Insufficient documentation

## 2017-04-23 DIAGNOSIS — Z806 Family history of leukemia: Secondary | ICD-10-CM

## 2017-04-23 DIAGNOSIS — F329 Major depressive disorder, single episode, unspecified: Secondary | ICD-10-CM | POA: Insufficient documentation

## 2017-04-23 DIAGNOSIS — E119 Type 2 diabetes mellitus without complications: Secondary | ICD-10-CM | POA: Insufficient documentation

## 2017-04-23 DIAGNOSIS — K219 Gastro-esophageal reflux disease without esophagitis: Secondary | ICD-10-CM | POA: Diagnosis not present

## 2017-04-23 DIAGNOSIS — Z7189 Other specified counseling: Secondary | ICD-10-CM | POA: Diagnosis not present

## 2017-04-23 DIAGNOSIS — Z7982 Long term (current) use of aspirin: Secondary | ICD-10-CM | POA: Insufficient documentation

## 2017-04-23 DIAGNOSIS — E7800 Pure hypercholesterolemia, unspecified: Secondary | ICD-10-CM | POA: Insufficient documentation

## 2017-04-23 DIAGNOSIS — E039 Hypothyroidism, unspecified: Secondary | ICD-10-CM | POA: Diagnosis not present

## 2017-04-23 DIAGNOSIS — Z9884 Bariatric surgery status: Secondary | ICD-10-CM | POA: Diagnosis not present

## 2017-04-23 DIAGNOSIS — F419 Anxiety disorder, unspecified: Secondary | ICD-10-CM | POA: Insufficient documentation

## 2017-04-23 DIAGNOSIS — R42 Dizziness and giddiness: Secondary | ICD-10-CM | POA: Diagnosis not present

## 2017-04-23 DIAGNOSIS — Z8041 Family history of malignant neoplasm of ovary: Secondary | ICD-10-CM | POA: Diagnosis not present

## 2017-04-23 MED ORDER — MECLIZINE HCL 32 MG PO TABS: 32.0000 mg | ORAL_TABLET | Freq: Three times a day (TID) | ORAL | 0 refills | Status: DC | PRN

## 2017-04-23 MED ORDER — MEGESTROL ACETATE 40 MG PO TABS: 40.0000 mg | ORAL_TABLET | Freq: Two times a day (BID) | ORAL | 6 refills | Status: DC

## 2017-04-23 NOTE — Patient Instructions (Signed)
Plan to begin taking Megace 40 mg twice daily for the next six months.  Please call us in three to four months to schedule an appt with Dr. Denman George for Feb 2019.  Please call for any questions or concerns.

## 2017-04-23 NOTE — Progress Notes (Signed)
Follow-up Note: Gyn-Onc  Consult was initially requested by Dr. Alden Hipp for the evaluation of April Scott 56 y.o. female  CC:  Chief Complaint  Patient presents with  . Endometrial adenocarcinoma [facility])    Assessment/Plan:  Ms. April Scott is a 56 y.o.  with stage IA grade 1 endometrial cancer with ITC's in the left SLN. Normal MSI testing.  I discussed the uncertain behavior of isolated tumor cells. I discussed that they are rarely associated with positive non-SLN's and that prognosis is typically tied to the underlying uterine pathology. In her case her uterine pathology is low risk (small grade 1 tumor, minimal invasion, no LVSI).  Therefore, treatment with cytotoxic therapy or radiation is unlikely to be of greater benefit than risk. However, she expressed some great concern for recurrence given her strong family history. Therefore, I felt it was reasonable to try progestin therapy for 6 months as most endometrial cancers are highly progestin responsive. I discussed the side effects and risks of progestins.  She has symptoms of vertigo. Represcribed meclizine. I recommended she discuss with Dr Forde Dandy if the symptoms persist.   I will see her back in 6 months.   HPI: Ms. April Scott is a 56 y.o.  gravida 2 para 0 reports intermittent vaginal spotting for approximately 6 months. The longest continuous flow lasted for approximately 3 weeks. Her last normal menstrual period was approximately one half years ago.  An endometrial biopsy collected on 06/2 27 2018 is notable for moderately differentiated endometrioid adenocarcinoma  (uterus sounded to 9 cm).  Angles Eisenhart reports intermittent vaginal spotting since that procedure. Her history is notable for sending type 2 diabetes mellitus with a hemoglobin A1c of 7.2. The last time she checked a fasting blood sugar was 3 weeks ago in the value was 120.  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 Her weight loss stalled out at 72 pounds.   Interval Hx:  On 03/20/17 she underwent robotic assisted total hysterectomy, BSO, SLN biopsy. Surgery was uncomplicated. Postoperatively pathology revealed an 0.5cm tumor which was grade 1, with 0.2cm of 1.5cm myometrial invasion, no LVSI. ITC's were seen in the left pelvic SLN (common iliac).  She has had vertigo postop. She has been treated with meclizine for this in the past.  Review of Systems:  Constitutional  Feels well,  Cardiovascular  No chest pain, shortness of breath, or edema  Pulmonary  No cough or wheeze.  Gastro Intestinal  No nausea, vomitting, or diarrhoea. No bright red blood per rectum, no abdominal pain, change in bowel movement, or constipation.  Genito Urinary  No frequency, urgency, dysuria, Vaginal spotting  Musculo Skeletal  No myalgia, arthralgia, joint swelling or pain  Neurologic  No weakness, numbness, change in gait,  Psychology  No depression, anxiety, insomnia.    Current Meds:  Outpatient Encounter Prescriptions as of 04/23/2017  Medication Sig  . acetaminophen (TYLENOL) 500 MG tablet Take 1,000 mg by mouth every 6 (six) hours as needed.  Marland Kitchen aspirin EC 81 MG tablet Take 81 mg by mouth every evening.   Marland Kitchen atorvastatin (LIPITOR) 10 MG tablet Take 10 mg by mouth every evening.   . Biotin w/ Vitamins C & E (HAIR/SKIN/NAILS PO) Take 1 tablet by mouth every evening.  . empagliflozin (JARDIANCE) 10 MG TABS tablet Take 10 mg by mouth every evening.   . hydrOXYzine (ATARAX/VISTARIL) 10 MG tablet Take 1 tablet (10 mg total) by mouth every 6 (  six) hours as needed for anxiety.  Marland Kitchen ibuprofen (ADVIL,MOTRIN) 200 MG tablet Take 600-800 mg by mouth 2 (two) times daily as needed for moderate pain.   Marland Kitchen JENTADUETO XR 01-999 MG TB24 Take 1 tablet by mouth every evening.   Marland Kitchen levothyroxine (SYNTHROID) 200 MCG tablet Take 1 tablet by mouth every evening  . lisinopril (PRINIVIL,ZESTRIL)  2.5 MG tablet Take 2.5 mg by mouth every evening.   Marland Kitchen oxyCODONE-acetaminophen (PERCOCET/ROXICET) 5-325 MG tablet Take 1-2 tablets by mouth every 4 (four) hours as needed (moderate to severe pain).  Marland Kitchen VIIBRYD 20 MG TABS Take 1 tablet by mouth every evening.    No facility-administered encounter medications on file as of 04/23/2017.     Allergy: No Known Allergies  Social Hx:   Social History   Social History  . Marital status: Married    Spouse name: N/A  . Number of children: N/A  . Years of education: N/A   Occupational History  . Not on file.   Social History Main Topics  . Smoking status: Never Smoker  . Smokeless tobacco: Never Used  . Alcohol use No  . Drug use: No  . Sexual activity: Not on file   Other Topics Concern  . Not on file   Social History Narrative  . No narrative on file    Past Surgical Hx:  Past Surgical History:  Procedure Laterality Date  . colonscopy  2017 and 2012   with polyp removal  . LAPAROSCOPIC GASTRIC SLEEVE RESECTION  2012  . ROBOTIC ASSISTED TOTAL HYSTERECTOMY WITH BILATERAL SALPINGO OOPHERECTOMY Bilateral 03/20/2017   Procedure: XI ROBOTIC ASSISTED TOTAL HYSTERECTOMY WITH BILATERAL SALPINGO OOPHORECTOMY WITH SENTINAL LYMPH NODES;  Surgeon: Everitt Amber, MD;  Location: [facility];  Service: Gynecology;  Laterality: Bilateral;  . spleen repair  2012 after gastric sleeve    Past Medical Hx:  Past Medical History:  Diagnosis Date  . Abnormal uterine bleeding 3 weeks ago  . Anxiety   . Arthritis    hands, bulging discs lower back  . Cancer (HCC)    endometrial  . Depression   . Diabetes mellitus without complication ([place])    type 2  . GERD (gastroesophageal reflux disease)   . Hypercholesteremia   . Hypothyroidism   . Renal disorder   . Thyroid disease   Mammogram 2017 within normal limits colonoscopy in 2017 removal of 2 benign polyps colonoscopy 5 years prior to that also associated removal of polyps  Past Gynecological History:    Gravida 2 para 0 last normal menstrual period 1-1/2 years ago menarche at age 36 irregular menses throughout adulthood   Family Hx: Maternal aunt with ovarian cancer diagnosis 95 years of age father with leukemia in early adulthood and lung cancer in later adulthood  Vitals:  Blood pressure 129/73, pulse 88, temperature 98.4 F (36.9 C), temperature source Oral, resp. rate 16, weight 236 lb 12.8 oz (107.4 kg), last menstrual period 02/19/2017, SpO2 98 %. Body mass index is 43.31 kg/m.   Physical Exam: WD in NAD Neck  Supple NROM, without any enlargements.  Lymph Node Survey No cervical supraclavicular or inguinal adenopathy Cardiovascular  Pulse normal rate, regularity and rhythm.  Lungs  Clear to auscultation bilaterally, without wheezes/crackles/rhonchi. Good air movement.  Psychiatry  Alert and oriented appropriate mood affect speech and reasoning. Abdomen  Normoactive bowel sounds, abdomen soft, non-tender. Very obese. Well healed incisions. Back No CVA tenderness Genito Urinary  Vaginal cuff healing normally. No bleeding. No lesions Good tone,  no masses no cul de sac nodularity.  Extremities  No bilateral cyanosis, clubbing or edema.   30 minutes of direct face to face counseling time was spent with the patient. This included discussion about prognosis, therapy recommendations and postoperative side effects and are beyond the scope of routine postoperative care.   Donaciano Eva, MD, PhD 04/23/2017, 4:23 PM

## 2017-05-23 DIAGNOSIS — Z6841 Body Mass Index (BMI) 40.0 and over, adult: Secondary | ICD-10-CM | POA: Diagnosis not present

## 2017-05-23 DIAGNOSIS — Z0000 Encounter for general adult medical examination without abnormal findings: Secondary | ICD-10-CM | POA: Diagnosis not present

## 2017-06-13 DIAGNOSIS — Z713 Dietary counseling and surveillance: Secondary | ICD-10-CM | POA: Diagnosis not present

## 2017-07-10 DIAGNOSIS — E1165 Type 2 diabetes mellitus with hyperglycemia: Secondary | ICD-10-CM | POA: Diagnosis not present

## 2017-07-10 DIAGNOSIS — C541 Malignant neoplasm of endometrium: Secondary | ICD-10-CM | POA: Diagnosis not present

## 2017-07-10 DIAGNOSIS — E038 Other specified hypothyroidism: Secondary | ICD-10-CM | POA: Diagnosis not present

## 2017-07-10 DIAGNOSIS — Z9884 Bariatric surgery status: Secondary | ICD-10-CM | POA: Diagnosis not present

## 2017-07-10 DIAGNOSIS — R740 Nonspecific elevation of levels of transaminase and lactic acid dehydrogenase [LDH]: Secondary | ICD-10-CM | POA: Diagnosis not present

## 2017-07-10 DIAGNOSIS — E7849 Other hyperlipidemia: Secondary | ICD-10-CM | POA: Diagnosis not present

## 2017-07-10 DIAGNOSIS — E669 Obesity, unspecified: Secondary | ICD-10-CM | POA: Diagnosis not present

## 2017-07-10 DIAGNOSIS — R202 Paresthesia of skin: Secondary | ICD-10-CM | POA: Diagnosis not present

## 2017-07-11 ENCOUNTER — Other Ambulatory Visit: Payer: Self-pay | Admitting: Endocrinology

## 2017-07-11 DIAGNOSIS — R202 Paresthesia of skin: Secondary | ICD-10-CM

## 2017-07-11 DIAGNOSIS — Z713 Dietary counseling and surveillance: Secondary | ICD-10-CM | POA: Diagnosis not present

## 2017-07-24 ENCOUNTER — Ambulatory Visit
Admission: RE | Admit: 2017-07-24 | Discharge: 2017-07-24 | Disposition: A | Discharge status: Home / Self Care | Payer: BLUE CROSS/BLUE SHIELD | Source: Ambulatory Visit | Attending: Endocrinology | Admitting: Endocrinology

## 2017-07-24 DIAGNOSIS — R202 Paresthesia of skin: Secondary | ICD-10-CM

## 2017-07-24 DIAGNOSIS — M50223 Other cervical disc displacement at C6-C7 level: Secondary | ICD-10-CM | POA: Diagnosis not present

## 2017-07-26 ENCOUNTER — Encounter: Payer: Self-pay | Admitting: Neurology

## 2017-07-30 ENCOUNTER — Other Ambulatory Visit: Payer: Self-pay | Admitting: Gynecologic Oncology

## 2017-07-30 DIAGNOSIS — C541 Malignant neoplasm of endometrium: Secondary | ICD-10-CM

## 2017-07-31 ENCOUNTER — Telehealth: Payer: Self-pay | Admitting: *Deleted

## 2017-07-31 NOTE — Telephone Encounter (Signed)
Called and gave the patient the date/time of her PET scan on December 5th at 1pm; arrive at 12:30pm. Gave the patient the instructions to have nothing to eat or drink 6 hrs prior to the test.

## 2017-08-15 ENCOUNTER — Encounter (HOSPITAL_COMMUNITY): Payer: Self-pay | Admitting: Radiology

## 2017-08-15 ENCOUNTER — Encounter (HOSPITAL_COMMUNITY)
Admission: RE | Admit: 2017-08-15 | Discharge: 2017-08-15 | Disposition: A | Discharge status: Home / Self Care | Payer: BLUE CROSS/BLUE SHIELD | Source: Ambulatory Visit | Attending: Gynecologic Oncology | Admitting: Gynecologic Oncology

## 2017-08-15 DIAGNOSIS — C541 Malignant neoplasm of endometrium: Secondary | ICD-10-CM | POA: Diagnosis not present

## 2017-08-15 LAB — GLUCOSE, CAPILLARY: Glucose-Capillary: 108 mg/dL — ABNORMAL HIGH (ref 65–99)

## 2017-08-15 MED ORDER — FLUDEOXYGLUCOSE F - 18 (FDG) INJECTION
11.5000 | Freq: Once | INTRAVENOUS | Status: AC | PRN
  Administered 2017-08-15: 11.5 via INTRAVENOUS

## 2017-08-17 ENCOUNTER — Telehealth: Payer: Self-pay | Admitting: Gynecologic Oncology

## 2017-08-17 NOTE — Telephone Encounter (Signed)
Called and discussed PET scan results.  Advised patient that I was going to fax the results to her surgeon who performed her gastric sleeve for input before arranging GI consultation.  She states that her surgeon was Dr. Norris Reda Citron at [facility] but she does not want to see him anymore.  She states her spleen was cut and she did not appreciate his bedside manner.  Advised her that I would discuss with Dr. Denman George for next steps and give her a call when Dr. Denman George returned to the office. No concerns voiced.

## 2017-08-27 ENCOUNTER — Telehealth: Payer: Self-pay | Admitting: Gastroenterology

## 2017-08-27 ENCOUNTER — Telehealth: Payer: Self-pay | Admitting: Gynecologic Oncology

## 2017-08-27 DIAGNOSIS — R948 Abnormal results of function studies of other organs and systems: Secondary | ICD-10-CM

## 2017-08-27 NOTE — Telephone Encounter (Signed)
Please see referral for patient and advise on scheduling. DOD for today 12.17.18; Dr.Stark.

## 2017-08-27 NOTE — Telephone Encounter (Signed)
Patient with abnormal esophagus on PET and endometrial adenocarcinoma.  They are requesting an  office visit to eval.  Patient notified of appt scheduled for 08/29/17 3:00 with Nicoletta Ba PA

## 2017-08-27 NOTE — Telephone Encounter (Signed)
Spoke with patient again about PET scan results.  Per Dr. Denman George, recommend GI referral to evaluate the activity seen in the distal esophagus.  Patient has not seen a GI physician.  Advised referral will be placed.  Advised she should be contacted with an appt and advised to call for any needs.

## 2017-08-29 ENCOUNTER — Encounter (INDEPENDENT_AMBULATORY_CARE_PROVIDER_SITE_OTHER): Payer: Self-pay

## 2017-08-29 ENCOUNTER — Encounter: Payer: Self-pay | Admitting: Physician Assistant

## 2017-08-29 ENCOUNTER — Encounter: Payer: Self-pay | Admitting: Gastroenterology

## 2017-08-29 ENCOUNTER — Ambulatory Visit: Payer: BLUE CROSS/BLUE SHIELD | Admitting: Physician Assistant

## 2017-08-29 VITALS — BP 96/58 | HR 100 | Ht 61.0 in | Wt 235.4 lb

## 2017-08-29 DIAGNOSIS — R933 Abnormal findings on diagnostic imaging of other parts of digestive tract: Secondary | ICD-10-CM

## 2017-08-29 DIAGNOSIS — R948 Abnormal results of function studies of other organs and systems: Secondary | ICD-10-CM

## 2017-08-29 NOTE — Patient Instructions (Signed)

## 2017-08-29 NOTE — Progress Notes (Signed)
Subjective:    Patient ID: April Scott, female    DOB: [birth year], 56 y.o.   MRN: [id number]  HPI April Scott is a pleasant 56 year old white female, new to GI today referred by Dr. Terrence Dupont Rossi/GYN oncology for further evaluation of abnormal PET scan. Patient was diagnosed with endometrial cancer this past summer and underwent a robotic assisted total hysterectomy BSO and sentinel node biopsies.  She was found to have stage I a disease. She has recently undergone a staging PET scan done 08/15/2017.  This showed some accentuated activity in the distal esophagus potentially with mild distal esophageal wall thickening, there is a 0.8 cm short axis lymph node adjacent to the distal esophagus essentially stable from imaging done in 2012 and is not hypermetabolic, no other hypermetabolic lymph nodes in the chest.  There are no findings of metastatic endometrial cancer in the neck chest abdomen or pelvis, there is a borderline enlarged portacaval lymph node with low-grade activity which also appears relatively stable through 2012. Patient has no history of any prior esophageal issues.  She denies any chronic problems with heartburn indigestion or acid reflux.   She is status post a sleeve gastrectomy done by a Dr. Volanda Napoleon in 2012.  She says this was complicated by a splenic tear and she had to go back to surgery the following day.  She says that she never really lost much weight after the sleeve gastrectomy but did not follow-up with the surgeon long-term. She is known to Dr. Paulita Fujita who has done prior colonoscopies.  She apparently does have history of colon polyps, and last had colonoscopy in 2017 and was told to return in 5 years.  Review of Systems Pertinent positive and negative review of systems were noted in the above HPI section.  All other review of systems was otherwise negative.  Outpatient Encounter Medications as of 08/29/2017  Medication Sig  . aspirin EC 81 MG tablet Take 81 mg by mouth  every evening.   Marland Kitchen atorvastatin (LIPITOR) 10 MG tablet Take 10 mg by mouth every evening.   . Biotin w/ Vitamins C & E (HAIR/SKIN/NAILS PO) Take 1 tablet by mouth every evening.  . empagliflozin (JARDIANCE) 10 MG TABS tablet Take 10 mg by mouth every evening.   Marland Kitchen ibuprofen (ADVIL,MOTRIN) 200 MG tablet Take 600-800 mg by mouth 2 (two) times daily as needed for moderate pain.   Marland Kitchen JENTADUETO XR 01-999 MG TB24 Take 1 tablet by mouth every evening.   Marland Kitchen levothyroxine (SYNTHROID) 200 MCG tablet Take 1 tablet by mouth every evening  . lisinopril (PRINIVIL,ZESTRIL) 2.5 MG tablet Take 2.5 mg by mouth every evening.   Marland Kitchen VIIBRYD 20 MG TABS Take 1 tablet by mouth every evening.   . [DISCONTINUED] acetaminophen (TYLENOL) 500 MG tablet Take 1,000 mg by mouth every 6 (six) hours as needed.  . [DISCONTINUED] hydrOXYzine (ATARAX/VISTARIL) 10 MG tablet Take 1 tablet (10 mg total) by mouth every 6 (six) hours as needed for anxiety.  . [DISCONTINUED] meclizine (ANTIVERT) 32 MG tablet Take 1 tablet (32 mg total) by mouth 3 (three) times daily as needed.  . [DISCONTINUED] megestrol (MEGACE) 40 MG tablet Take 1 tablet (40 mg total) by mouth 2 (two) times daily.  . [DISCONTINUED] oxyCODONE-acetaminophen (PERCOCET/ROXICET) 5-325 MG tablet Take 1-2 tablets by mouth every 4 (four) hours as needed (moderate to severe pain).   No facility-administered encounter medications on file as of 08/29/2017.    No Known Allergies Patient Active Problem List   Diagnosis Date  Noted  . Endometrial cancer ([place]) 03/20/2017  . Diabetes mellitus ([place]) 03/15/2017  . Obesity 03/15/2017   Social History   Socioeconomic History  . Marital status: Married    Spouse name: Not on file  . Number of children: 0  . Years of education: Not on file  . Highest education level: Not on file  Social Needs  . Financial resource strain: Not on file  . Food insecurity - worry: Not on file  . Food insecurity - inability: Not on file  .  Transportation needs - medical: Not on file  . Transportation needs - non-medical: Not on file  Occupational History  . Occupation: HR  Tobacco Use  . Smoking status: Never Smoker  . Smokeless tobacco: Never Used  Substance and Sexual Activity  . Alcohol use: No  . Drug use: No  . Sexual activity: Not on file  Other Topics Concern  . Not on file  Social History Narrative  . Not on file    April Scott's family history includes Brain cancer in her maternal aunt; Colon polyps in her mother; Diabetes in her brother, father, and mother; Heart disease in her maternal grandfather and paternal grandfather; Lung cancer in her father and paternal uncle; Lymphoma in her father, paternal aunt, and paternal aunt; Ovarian cancer in her maternal aunt; Pancreatic cancer in her paternal uncle; Stroke in her maternal grandmother; Throat cancer in her paternal aunt.      Objective:    Vitals:   08/29/17 1444  BP: (!) 96/58  Pulse: 100    Physical Exam well-developed white female in no acute distress, very pleasant blood pressure 96/58 pulse 100, height 5 foot 1, weight 235, BMI 44.4.  HEENT; nontraumatic normocephalic EOMI PERRLA sclera anicteric, Cardiovascular; regular rate and rhythm with S1-S2 no murmur rub or gallop, Pulmonary; clear bilaterally, Abdomen; obese, soft nontender no palpable mass or hepatosplenomegaly, Rectal; exam not done, Extremities; no clubbing cyanosis or edema skin warm and dry, Neuro psych; mood and affect appropriate       Assessment & Plan:   #64 56 year old white female who was diagnosed with endometrial adenocarcinoma July 2018 at which time she underwent total hysterectomy and BSO. Recent staging PET scan shows no evidence of metastatic disease in the abdomen.  PET scan did show accentuated activity in the distal esophagus just proximal to the beginning of the postoperative gastroesophageal region with some slight wall thickening.  Could not rule out esophagitis  physiologic activity or conceivably esophageal tumor. Patient is asymptomatic with no esophageal symptoms.  #2 status post sleeve gastrectomy 2012-minimal weight loss, surgery complicated by splenic injury #3.  Morbid obesity-BMI 44 #4.  Adult onset diabetes mellitus #5.  History of colon polyps, previous colonoscopies have been done by Dr. Paulita Fujita but his last exam 2017 was told to have 5-year interval follow-up.  Plan; Patient will be scheduled for upper endoscopy with Dr. Fuller Plan.  Procedure was discussed in detail with the patient including indications risks and benefits and she is agreeable to proceed.  April Ambrocio Genia Harold PA-C 08/29/2017   Cc: Reynold Bowen, MD

## 2017-09-04 IMAGING — DX DG CHEST 2V
2 series · 2 of 2 positions shown · non-contrast
Comparison: May 10, 2016

CLINICAL DATA: Endometrial carcinoma. Preoperative evaluation.
Hypertension.

EXAM:
CHEST  2 VIEW

[chest pa]
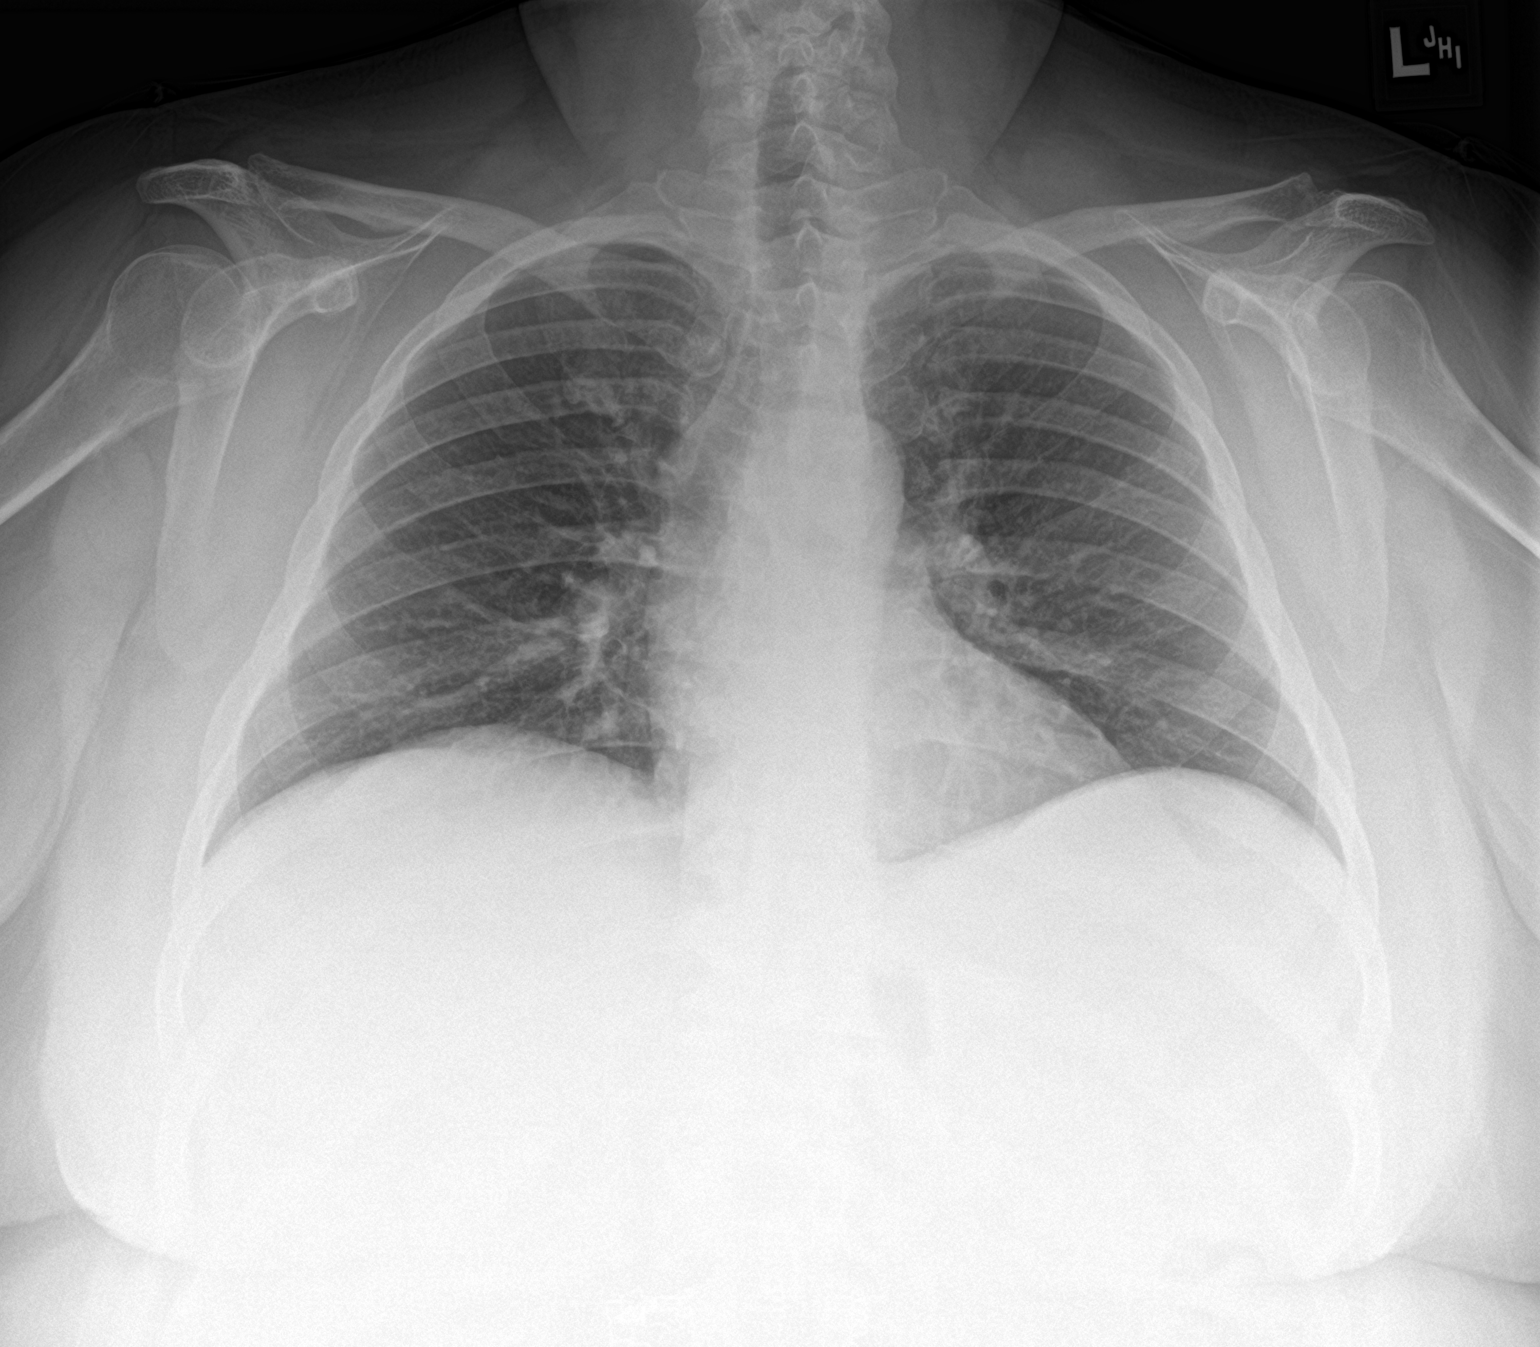

[chest lat]
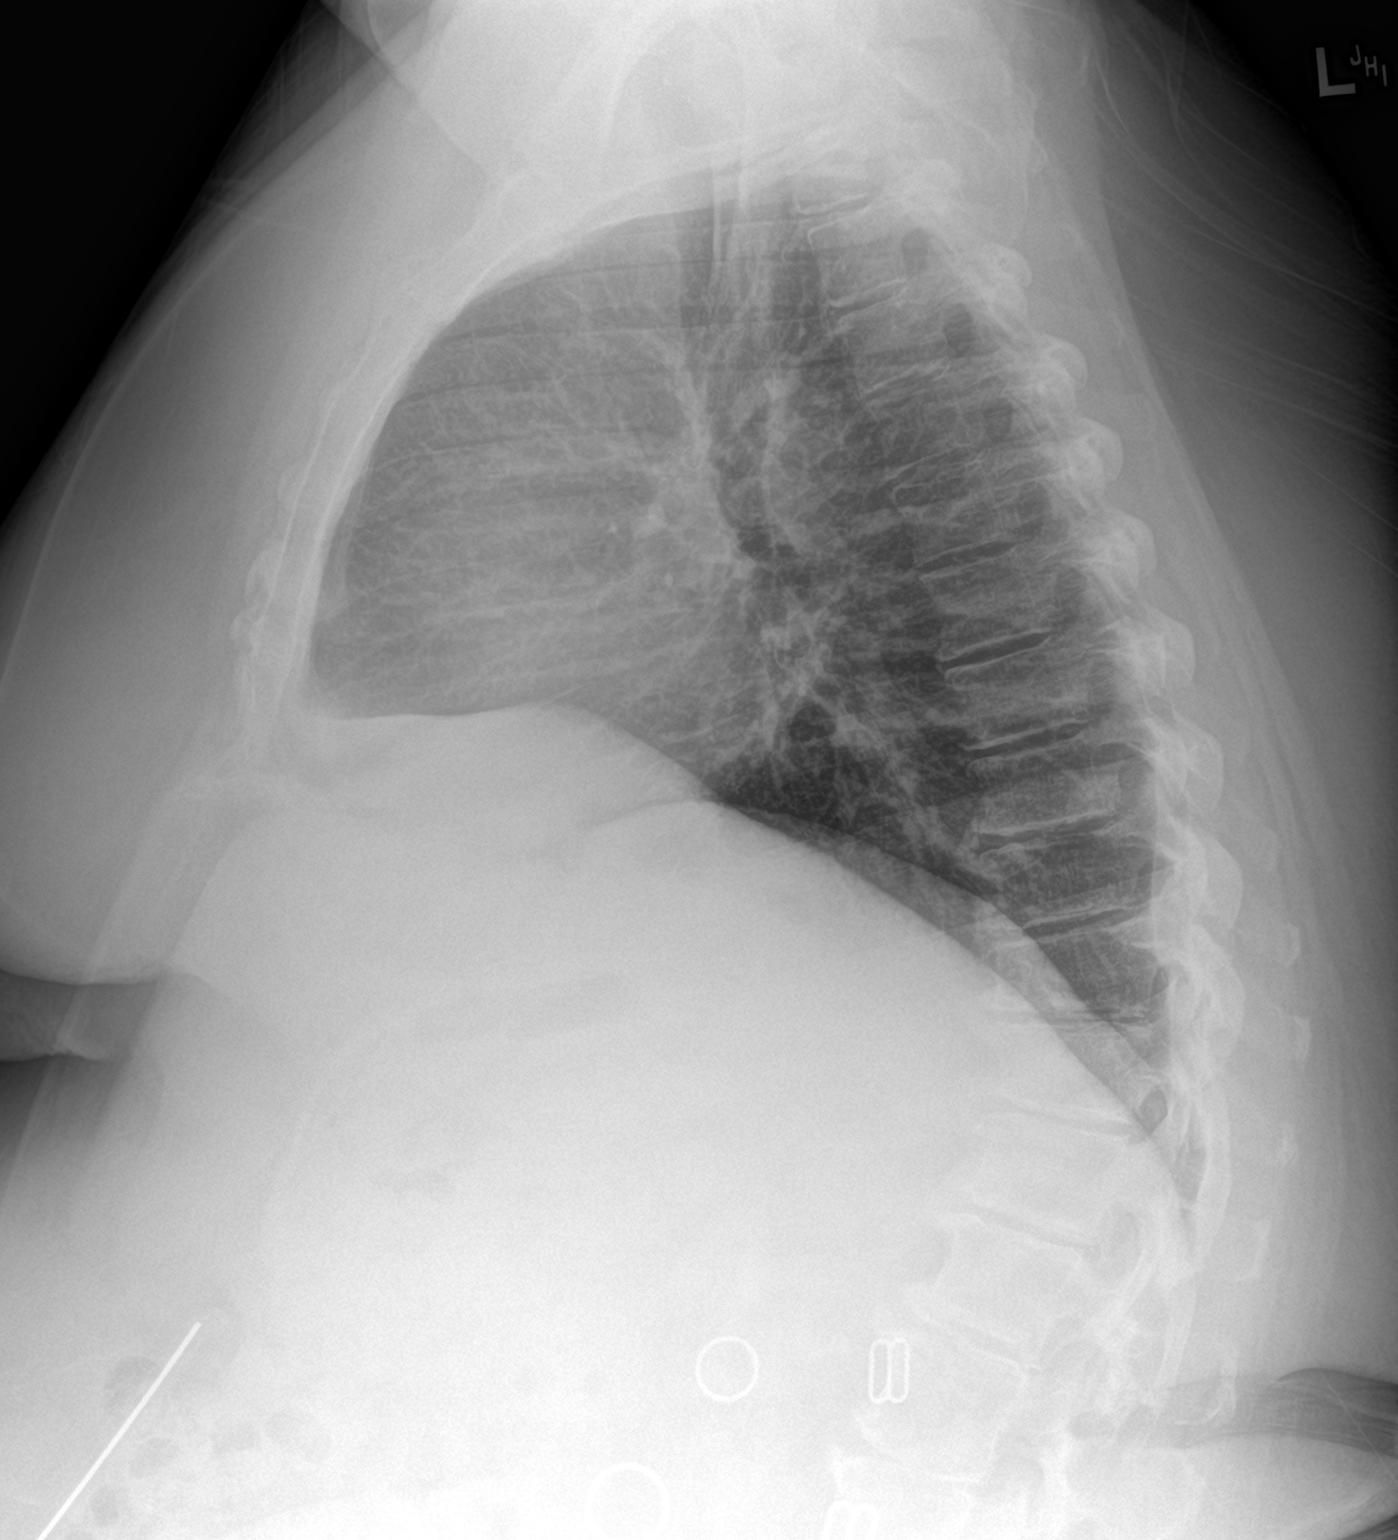

[2 of 2 positions shown; findings below may reference images not displayed]

FINDINGS: Lungs are clear. Heart size and pulmonary vascularity are normal. No
adenopathy. No blastic or lytic bone lesions. There is degenerative
change in the lower thoracic and upper lumbar spine regions.
IMPRESSION: No edema or consolidation.  No evident adenopathy.

## 2017-09-10 ENCOUNTER — Encounter: Payer: Self-pay | Admitting: Gastroenterology

## 2017-09-10 ENCOUNTER — Other Ambulatory Visit: Payer: Self-pay

## 2017-09-10 ENCOUNTER — Ambulatory Visit (AMBULATORY_SURGERY_CENTER): Payer: BLUE CROSS/BLUE SHIELD | Admitting: Gastroenterology

## 2017-09-10 VITALS — BP 90/57 | HR 74 | Temp 97.8°F | Resp 21 | Ht 61.0 in | Wt 235.0 lb

## 2017-09-10 DIAGNOSIS — K209 Esophagitis, unspecified: Secondary | ICD-10-CM

## 2017-09-10 DIAGNOSIS — R948 Abnormal results of function studies of other organs and systems: Secondary | ICD-10-CM | POA: Diagnosis present

## 2017-09-10 DIAGNOSIS — R933 Abnormal findings on diagnostic imaging of other parts of digestive tract: Secondary | ICD-10-CM | POA: Diagnosis not present

## 2017-09-10 MED ORDER — SODIUM CHLORIDE 0.9 % IV SOLN: 500.0000 mL | INTRAVENOUS | Status: DC

## 2017-09-10 MED ORDER — OMEPRAZOLE 40 MG PO CPDR: 40.0000 mg | DELAYED_RELEASE_CAPSULE | Freq: Every day | ORAL | 11 refills | Status: DC

## 2017-09-10 NOTE — Progress Notes (Signed)
Report given to PACU, vss 

## 2017-09-10 NOTE — Progress Notes (Signed)
Pt's states no medical or surgical changes since previsit or office visit. 

## 2017-09-10 NOTE — Op Note (Signed)
[facility] Patient Name: April Scott Procedure Date: 09/10/2017 9:18 AM MRN: [id number] Endoscopist: Ladene Artist , MD Age: 56 Referring MD:  Date of Birth: 1960-09-23 Gender: Female Account #: [id number] Procedure:                Upper GI endoscopy Indications:              Abnormal PET scan of the GI tract (distal esophagus) Medicines:                Monitored Anesthesia Care Procedure:                Pre-Anesthesia Assessment:                           - Prior to the procedure, a History and Physical                            was performed, and patient medications and                            allergies were reviewed. The patient's tolerance of                            previous anesthesia was also reviewed. The risks                            and benefits of the procedure and the sedation                            options and risks were discussed with the patient.                            All questions were answered, and informed consent                            was obtained. Prior Anticoagulants: The patient has                            taken no previous anticoagulant or antiplatelet                            agents. ASA Grade Assessment: II - A patient with                            mild systemic disease. After reviewing the risks                            and benefits, the patient was deemed in                            satisfactory condition to undergo the procedure.                           After obtaining informed consent, the endoscope was  passed under direct vision. Throughout the                            procedure, the patient's blood pressure, pulse, and                            oxygen saturations were monitored continuously. The                            Endoscope was introduced through the mouth, and                            advanced to the second part of duodenum. The upper       GI endoscopy was accomplished without difficulty.                            The patient tolerated the procedure well. Scope In: Scope Out: Findings:                 LA Grade B (one or more mucosal breaks greater than                            5 mm, not extending between the tops of two mucosal                            folds) esophagitis with no bleeding was found in                            the distal esophagus.                           The exam of the esophagus was otherwise normal.                           Evidence of a sleeve gastrectomy was found in the                            stomach. This was characterized by healthy                            appearing mucosa.                           A medium-sized hiatal hernia was present.                           The exam of the stomach was otherwise normal.                           The duodenal bulb and second portion of the                            duodenum were normal. Complications:            No immediate complications. Estimated Blood Loss:  Estimated blood loss: none. Impression:               - LA Grade B reflux esophagitis.                           - A sleeve gastrectomy was found in the stomach,                            characterized by healthy appearing mucosa.                           - Medium-sized hiatal hernia.                           - Normal duodenal bulb and second portion of the                            duodenum.                           - No specimens collected. Recommendation:           - Patient has a contact number available for                            emergencies. The signs and symptoms of potential                            delayed complications were discussed with the                            patient. Return to normal activities tomorrow.                            Written discharge instructions were provided to the                            patient.                           -  Resume previous diet.                           - Antireflux measurses long term                           - Continue present medications.                           - Prilosec (omeprazole) 40 mg PO daily, 1 year of                            refills. Ladene Artist, MD 09/10/2017 9:39:14 AM This report has been signed electronically.

## 2017-09-10 NOTE — Patient Instructions (Signed)
Discharge instructions given. Handouts on esophagitis and a hiatal hernia. Prescription sent to pharmacy. Resume previous medications. YOU HAD AN ENDOSCOPIC PROCEDURE TODAY AT [facility]:   Refer to the procedure report that was given to you for any specific questions about what was found during the examination.  If the procedure report does not answer your questions, please call your gastroenterologist to clarify.  If you requested that your care partner not be given the details of your procedure findings, then the procedure report has been included in a sealed envelope for you to review at your convenience later.  YOU SHOULD EXPECT: Some feelings of bloating in the abdomen. Passage of more gas than usual.  Walking can help get rid of the air that was put into your GI tract during the procedure and reduce the bloating. If you had a lower endoscopy (such as a colonoscopy or flexible sigmoidoscopy) you may notice spotting of blood in your stool or on the toilet paper. If you underwent a bowel prep for your procedure, you may not have a normal bowel movement for a few days.  Please Note:  You might notice some irritation and congestion in your nose or some drainage.  This is from the oxygen used during your procedure.  There is no need for concern and it should clear up in a day or so.  SYMPTOMS TO REPORT IMMEDIATELY:   Following upper endoscopy (EGD)  Vomiting of blood or coffee ground material  New chest pain or pain under the shoulder blades  Painful or persistently difficult swallowing  New shortness of breath  Fever of 100F or higher  Black, tarry-looking stools  For urgent or emergent issues, a gastroenterologist can be reached at any hour by calling [phone].   DIET:  We do recommend a small meal at first, but then you may proceed to your regular diet.  Drink plenty of fluids but you should avoid alcoholic beverages for 24 hours.  ACTIVITY:  You should plan to  take it easy for the rest of today and you should NOT DRIVE or use heavy machinery until tomorrow (because of the sedation medicines used during the test).    FOLLOW UP: Our staff will call the number listed on your records the next business day following your procedure to check on you and address any questions or concerns that you may have regarding the information given to you following your procedure. If we do not reach you, we will leave a message.  However, if you are feeling well and you are not experiencing any problems, there is no need to return our call.  We will assume that you have returned to your regular daily activities without incident.  If any biopsies were taken you will be contacted by phone or by letter within the next 1-3 weeks.  Please call us at [phone] if you have not heard about the biopsies in 3 weeks.    SIGNATURES/CONFIDENTIALITY: You and/or your care partner have signed paperwork which will be entered into your electronic medical record.  These signatures attest to the fact that that the information above on your After Visit Summary has been reviewed and is understood.  Full responsibility of the confidentiality of this discharge information lies with you and/or your care-partner.

## 2017-09-12 ENCOUNTER — Telehealth: Payer: Self-pay | Admitting: *Deleted

## 2017-09-12 NOTE — Telephone Encounter (Signed)
  Follow up Call-  Call back number 09/10/2017  Post procedure Call Back phone  # [phone]  Permission to leave phone message Yes  Some recent data might be hidden     Patient questions:  Do you have a fever, pain , or abdominal swelling? No. Pain Score  0 *  Have you tolerated food without any problems? Yes.    Have you been able to return to your normal activities? Yes.    Do you have any questions about your discharge instructions: Diet   No. Medications  No. Follow up visit  No.  Do you have questions or concerns about your Care? No.  Actions: * If pain score is 4 or above: No action needed, pain <4.

## 2017-10-15 DIAGNOSIS — M5432 Sciatica, left side: Secondary | ICD-10-CM | POA: Diagnosis not present

## 2017-10-30 DIAGNOSIS — I10 Essential (primary) hypertension: Secondary | ICD-10-CM | POA: Diagnosis not present

## 2017-10-30 DIAGNOSIS — E1165 Type 2 diabetes mellitus with hyperglycemia: Secondary | ICD-10-CM | POA: Diagnosis not present

## 2017-10-30 DIAGNOSIS — K209 Esophagitis, unspecified: Secondary | ICD-10-CM | POA: Diagnosis not present

## 2017-10-30 DIAGNOSIS — Z9884 Bariatric surgery status: Secondary | ICD-10-CM | POA: Diagnosis not present

## 2017-10-30 DIAGNOSIS — Z6841 Body Mass Index (BMI) 40.0 and over, adult: Secondary | ICD-10-CM | POA: Diagnosis not present

## 2017-10-30 DIAGNOSIS — Z1389 Encounter for screening for other disorder: Secondary | ICD-10-CM | POA: Diagnosis not present

## 2017-10-30 DIAGNOSIS — C541 Malignant neoplasm of endometrium: Secondary | ICD-10-CM | POA: Diagnosis not present

## 2017-10-31 ENCOUNTER — Ambulatory Visit: Payer: BLUE CROSS/BLUE SHIELD | Admitting: Neurology

## 2017-10-31 ENCOUNTER — Encounter: Payer: Self-pay | Admitting: Neurology

## 2017-10-31 VITALS — BP 110/80 | HR 96 | Ht 61.0 in | Wt 235.5 lb

## 2017-10-31 DIAGNOSIS — G5603 Carpal tunnel syndrome, bilateral upper limbs: Secondary | ICD-10-CM | POA: Diagnosis not present

## 2017-10-31 DIAGNOSIS — M5412 Radiculopathy, cervical region: Secondary | ICD-10-CM

## 2017-10-31 MED ORDER — GABAPENTIN 300 MG PO CAPS: ORAL_CAPSULE | ORAL | 5 refills | Status: DC

## 2017-10-31 NOTE — Patient Instructions (Addendum)
Start gabapentin 300mg  at bedtime x 1 week, then increase to 300mg  twice daily.    Start neck physiotherapy  NCS/EMG of the arms  Return to clinic in 4 months

## 2017-10-31 NOTE — Progress Notes (Signed)
[facility] Clinic Note - Initial Visit   Date: 10/31/17  Larin Depaoli MRN: [id number] DOB: June 03, 1961   Dear Dr. Forde Dandy:  Thank you for your kind referral of Brithney Bensen for consultation of bilateral hand paresthesias. Although her history is well known to you, please allow [place] to reiterate it for the purpose of our medical record. The patient was accompanied to the clinic by self.    History of Present Illness: April Scott is a 57 y.o. right-handed Caucasian female with hypothyroidism, hyperlipidemia, PCOS, GERD, poorly controlled diabetic mellitus (HbA1c 9.0), depression, and endometrial cancer s/p hysterectomy and BSO presenting for evaluation of bilateral arm paresthesias.    Around the end of July 2018, she started to having numbness over the shoulder and when she lays down, she has tingling/numbness involving her forearm and hands, worse on the right.  She has been sleeping in a recliner because she is able to extend her arms and the numbness is not bothersome.   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  She denies any neck pain or weakness.    Out-side paper records, electronic medical record, and images have been reviewed where available and summarized as:  MRI cervical spine wo contrast 07/24/2017:   Degenerative spondylosis at C-6.  Bilateral foraminal stenosis that could compress either or both C6 nerves.   Mild, non-compressive spondylosis at C3-4 and C6-7.  Labs 07/11/2017:  Vitamin B12 569, TSH 0.13, free T4 1.3  Past Medical History:  Diagnosis Date  . Abnormal uterine bleeding 3 weeks ago  . Anxiety   . Arthritis    hands, bulging discs lower back  . Colon polyps   . Depression   . Diabetes mellitus without complication ([place])    type 2  . Endometrial cancer ([place])   . GERD (gastroesophageal reflux disease)   . Hypercholesteremia    . Hypothyroidism   . Renal disorder   . Sleep apnea   . Thyroid disease     Past Surgical History:  Procedure Laterality Date  . colonscopy  2017 and 2012   with polyp removal  . LAPAROSCOPIC GASTRIC SLEEVE RESECTION  2012  . ROBOTIC ASSISTED TOTAL HYSTERECTOMY WITH BILATERAL SALPINGO OOPHERECTOMY Bilateral 03/20/2017   Procedure: XI ROBOTIC ASSISTED TOTAL HYSTERECTOMY WITH BILATERAL SALPINGO OOPHORECTOMY WITH SENTINAL LYMPH NODES;  Surgeon: Everitt Amber, MD;  Location: [facility];  Service: Gynecology;  Laterality: Bilateral;  . spleen repair  2012 after gastric sleeve     Medications:  Outpatient Encounter Medications as of 10/31/2017  Medication Sig  . atorvastatin (LIPITOR) 10 MG tablet Take 20 mg by mouth every evening.   . empagliflozin (JARDIANCE) 10 MG TABS tablet Take 10 mg by mouth every evening.   . fluconazole (DIFLUCAN) 150 MG tablet   . ibuprofen (ADVIL,MOTRIN) 200 MG tablet Take 600-800 mg by mouth 2 (two) times daily as needed for moderate pain.   Marland Kitchen JENTADUETO XR 01-999 MG TB24 Take 1 tablet by mouth every evening.   Marland Kitchen levothyroxine (SYNTHROID) 200 MCG tablet Take 1 tablet by mouth every evening  . lisinopril (PRINIVIL,ZESTRIL) 2.5 MG tablet Take 2.5 mg by mouth every evening.   . megestrol (MEGACE) 40 MG tablet Take 40 mg by mouth 2 (two) times daily.  Marland Kitchen omeprazole (PRILOSEC) 40 MG capsule Take 1 capsule (40 mg total) by mouth daily.  Marland Kitchen VIIBRYD 20 MG TABS Take 1 tablet by mouth  every evening.   Marland Kitchen aspirin EC 81 MG tablet Take 81 mg by mouth every evening.   . Biotin w/ Vitamins C & E (HAIR/SKIN/NAILS PO) Take 1 tablet by mouth every evening.  . gabapentin (NEURONTIN) 300 MG capsule Take 1 tablet at bedtime x 1 week, then increase to 1 tablet twice daily.   Facility-Administered Encounter Medications as of 10/31/2017  Medication  . 0.9 %  sodium chloride infusion     Allergies: No Known Allergies  Family History: Family History  Problem Relation Age of Onset  .  Colon polyps Mother   . Diabetes Mother        diet controlled  . Lung cancer Father   . Lymphoma Father   . Diabetes Father   . Diabetes Brother   . Stroke Maternal Grandmother   . Heart disease Maternal Grandfather   . Heart disease Paternal Grandfather   . Ovarian cancer Maternal Aunt   . Lymphoma Paternal Aunt   . Lymphoma Paternal Aunt   . Brain cancer Maternal Aunt   . Pancreatic cancer Paternal Uncle   . Throat cancer Paternal Aunt        smoker  . Lung cancer Paternal Uncle     Social History: Social History   Tobacco Use  . Smoking status: Never Smoker  . Smokeless tobacco: Never Used  Substance Use Topics  . Alcohol use: No  . Drug use: No   Social History   Social History Narrative   Lives with husband in a one story home.  Has 6 children.  Works in [employer] for [employer].  Education: college.     Review of Systems:  CONSTITUTIONAL: No fevers, chills, night sweats, or weight loss.   EYES: No visual changes or eye pain ENT: No hearing changes.  No history of nose bleeds.   RESPIRATORY: No cough, wheezing and shortness of breath.   CARDIOVASCULAR: Negative for chest pain, and palpitations.   GI: Negative for abdominal discomfort, blood in stools or black stools.  No recent change in bowel habits.   GU:  No history of incontinence.   MUSCLOSKELETAL: No history of joint pain or swelling.  No myalgias.   SKIN: Negative for lesions, rash, and itching.   HEMATOLOGY/ONCOLOGY: Negative for prolonged bleeding, bruising easily, and swollen nodes.  +history of cancer.   ENDOCRINE: Negative for cold or heat intolerance, polydipsia or goiter.   PSYCH:  +depression or anxiety symptoms.   NEURO: As Above.   Vital Signs:  BP 110/80   Pulse 96   Ht 5\' 1"  (1.549 m)   Wt 235 lb 8 oz (106.8 kg)   LMP 02/19/2017 (Approximate)   SpO2 97%   BMI 44.50 kg/m    General Medical Exam:   General:  Well appearing, morbidly obese, comfortable.   Eyes/ENT: see cranial nerve  examination.   Neck: No masses appreciated.  Full range of motion without tenderness.  No carotid bruits. Respiratory:  Clear to auscultation, good air entry bilaterally.   Cardiac:  Regular rate and rhythm, no murmur.   Extremities:  No deformities, edema, or skin discoloration.  Skin:  No rashes or lesions.  Neurological Exam: MENTAL STATUS including orientation to time, place, person, recent and remote memory, attention span and concentration, language, and fund of knowledge is normal.  Speech is not dysarthric.  CRANIAL NERVES: II:  No visual field defects.  Unremarkable fundi.   III-IV-VI: Pupils equal round and reactive to light.  Normal conjugate, extra-ocular eye movements  in all directions of gaze.  No nystagmus.  No ptosis.   V:  Normal facial sensation.    VII:  Normal facial symmetry and movements.   VIII:  Normal hearing and vestibular function.   IX-X:  Normal palatal movement.   XI:  Normal shoulder shrug and head rotation.   XII:  Normal tongue strength and range of motion, no deviation or fasciculation.  MOTOR:  No atrophy, fasciculations or abnormal movements.  No pronator drift.  Tone is normal.    Right Upper Extremity:    Left Upper Extremity:    Deltoid  5/5   Deltoid  5/5   Biceps  5/5   Biceps  5/5   Triceps  5/5   Triceps  5/5   Wrist extensors  5/5   Wrist extensors  5/5   Wrist flexors  5/5   Wrist flexors  5/5   Finger extensors  5/5   Finger extensors  5/5   Finger flexors  5/5   Finger flexors  5/5   Dorsal interossei  5/5   Dorsal interossei  5/5   Abductor pollicis  5/5   Abductor pollicis  5/5   Tone (Ashworth scale)  0  Tone (Ashworth scale)  0   Right Lower Extremity:    Left Lower Extremity:    Hip flexors  5/5   Hip flexors  5/5   Hip extensors  5/5   Hip extensors  5/5   Knee flexors  5/5   Knee flexors  5/5   Knee extensors  5/5   Knee extensors  5/5   Dorsiflexors  5/5   Dorsiflexors  5/5   Plantarflexors  5/5   Plantarflexors  5/5     Toe extensors  5/5   Toe extensors  5/5   Toe flexors  5/5   Toe flexors  5/5   Tone (Ashworth scale)  0  Tone (Ashworth scale)  0   MSRs:  Right                                                                 Left brachioradialis 2+  brachioradialis 2+  biceps 2+  biceps 2+  triceps 2+  triceps 2+  patellar 2+  patellar 2+  ankle jerk 2+  ankle jerk 2+  Hoffman no  Hoffman no  plantar response down  plantar response down   SENSORY:  Normal and symmetric perception of light touch, pinprick, vibration, and proprioception.  Romberg's sign absent. Tinel's sign is positive at both wrists.  COORDINATION/GAIT: Normal finger-to- nose-finger.  Intact rapid alternating movements bilaterally.  Able to rise from a chair without using arms.  Gait slightly wide-based due to body habitus, stable and unassisted.  Tandem gait intact.   IMPRESSION: 1.  Cervical radiculopathy affecting C6 nerve roots bilaterally.  I have personally viewed her MRI cervical spine with patient and there is both spinal and biforaminal stenosis at this level.  Her proximal arm and radicular paresthesias does conform with C6 dermatome.  Recommend starting with conservative therapies - neck physiotherapy and gabapentin 300mg  at bedtime x 1 week, then increase to 300mg  BID.    2.  Bilateral hand paresthesias ?carpal tunnel syndrome.  Exam shows positive Tinel's sign at both wrists and with  her occupation working at a computer all day, she may certainly have superimposed carpal tunnel syndrome.  She will have NCS/EMG of the arms to better characterize the nature of her paresthesias.   3.  Poorly controlled diabetes mellitus.  She does not have signs of neuropathy on her clinical exam.  Nevertheless, I urged her to keep tight control of blood sugars through medication and lifestyle modification to minimize risk of diabetic complications.  Return to clinic in 3-4 months   Thank you for allowing me to participate in patient's care.   If I can answer any additional questions, I would be pleased to do so.    Sincerely,    Donika K. Posey Pronto, DO

## 2017-11-02 ENCOUNTER — Telehealth: Payer: Self-pay | Admitting: *Deleted

## 2017-11-02 NOTE — Telephone Encounter (Signed)
Per referral from Dr. Forde Dandy, patient scheduled to see Dr. Denman George on March 20th at 1:30pm. Patient to arrive at 1:15pm. Patient aware of appt

## 2017-11-15 ENCOUNTER — Ambulatory Visit (INDEPENDENT_AMBULATORY_CARE_PROVIDER_SITE_OTHER): Payer: BLUE CROSS/BLUE SHIELD | Admitting: Neurology

## 2017-11-15 ENCOUNTER — Telehealth: Payer: Self-pay | Admitting: Neurology

## 2017-11-15 DIAGNOSIS — M5412 Radiculopathy, cervical region: Secondary | ICD-10-CM | POA: Diagnosis not present

## 2017-11-15 DIAGNOSIS — G5603 Carpal tunnel syndrome, bilateral upper limbs: Secondary | ICD-10-CM

## 2017-11-15 NOTE — Progress Notes (Signed)
    Follow-up Visit   Date: 11/15/17    Imagine Nest MRN: [id number] DOB: 08-30-1961   Interim History: April Scott is a 57 y.o. right-handed Caucasian female with hypothyroidism, hyperlipidemia, PCOS, GERD, poorly controlled diabetic mellitus (HbA1c 9.0), depression, and endometrial cancer s/p hysterectomy and BSO returning to the clinic for follow-up of bilateral hand paresthesias and diagnostic testing.    History of present illness: Around the end of July 2018, she started to having numbness over the shoulder and when she lays down, she has tingling/numbness involving her forearm and hands, worse on the right.  She has been sleeping in a recliner because she is able to extend her arms and the numbness is not bothersome.   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  She denies any neck pain or weakness.    Update:  Electrodiagnostic testing of the upper extremity shows bilateral carpal tunnel syndrome, which is severe in degree electrically. There is also evidence of a superimposed right C6 radiculopathy, which is mild. She has noticed mild relief with gabapentin 300 mg at bedtime and denies any side effects.  Exam continues to show positive Tinel sign bilaterally. There is very subtle weakness of bilateral APB muscles, no atrophy.  Data: MRI cervical spine wo contrast 07/24/2017:   Degenerative spondylosis at C-6.  Bilateral foraminal stenosis that could compress either or both C6 nerves.  Mild, non-compressive spondylosis at C3-4 and C6-7.  Labs 07/11/2017:  Vitamin B12 569, TSH 0.13, free T4 1.3  NCS/EMG of the arms 11/15/2017: 1. Bilateral median neuropathy at or distal to the wrist, consistent with the clinical diagnosis of carpal tunnel syndrome. Overall, these findings are severe in degree electrically. 2. Chronic C6 radiculopathy affecting the right upper extremity, mild in  degree electrically.   IMPRESSION/PLAN: 1.  Bilateral carpal tunnel syndrome, severe in degree electrically. The results of her testing today was discussed and given the severity, I do not see that she will have significant improvement with conservative therapies. I will refer her to an orthopedic specialist for further management. In the meantime, I recommend she start using wrist splints to prevent over flexion of the wrist.  She can also increase gabapentin to 600 mg at bedtime.  2.  Cervical radiculopathy affecting C6 roots, worse on the right. She will be starting neck physiotherapy soon.  Return to clinic in 3 months   The duration of this appointment visit was 15 minutes of face-to-face time with the patient.  Greater than 50% of this time was spent in counseling, explanation of diagnosis, planning of further management, and coordination of care.   Thank you for allowing me to participate in patient's care.  If I can answer any additional questions, I would be pleased to do so.    Sincerely,    Emet Rafanan K. Posey Pronto, DO

## 2017-11-15 NOTE — Telephone Encounter (Signed)
-----   Message from Alda Berthold, DO sent at 11/15/2017  9:33 AM EST ----- Please refer patient to Dr. Faythe Casa at [facility] for bilateral carpal tunnel syndrome.  Please send my note from today and I will print the EMG to attach with the fax. Thanks.

## 2017-11-15 NOTE — Telephone Encounter (Signed)
Referral faxed to [phone] with confirmation received. They will call patient to schedule new patient appt.

## 2017-11-15 NOTE — Procedures (Signed)
[facility]  [address], [address]  [place], [place] [address] Tel: [phone] Fax:  [phone] Test Date:  11/15/2017  Patient: April Scott DOB: 18-Mar-1961 Physician: Narda Amber, DO  Sex: Female Height: 5\' 1"  Ref Phys: Narda Amber, DO  ID#: [id number] Temp: 37.1C Technician:    Patient Complaints: This is a 57 year old female referred for evaluation of bilateral hand paresthesias.  NCV & EMG Findings: Extensive electrodiagnostic testing of the right upper extremity and additional studies of the left shows:  1. Bilateral median sensory responses show prolonged distal peak latency (R5.1, L4.8 ms) and reduced amplitude (R3.7, L7.5 V).  Bilateral ulnar sensory responses are within normal limits. 2. Bilateral median motor responses show prolonged distal onset latency (R 5.3, L5.0 ms) and reduced amplitude (R5.1, L4.5 mV).  Bilateral ulnar motor responses are within normal limits.  3. Chronic motor axon loss changes are seen affecting bilateral abductor pollicis brevis muscles, as well as the right pronator teres and biceps muscles. There is no evidence of accompanied active denervation.    Impression: 1. Bilateral median neuropathy at or distal to the wrist, consistent with the clinical diagnosis of carpal tunnel syndrome. Overall, these findings are severe in degree electrically. 2. Chronic C6 radiculopathy affecting the right upper extremity, mild in degree electrically.    ___________________________ Narda Amber, DO    Nerve Conduction Studies Anti Sensory Summary Table   Site NR Peak (ms) Norm Peak (ms) P-T Amp (V) Norm P-T Amp  Left Median Anti Sensory (2nd Digit)  Wrist    4.8 <3.6 7.5 >15  Right Median Anti Sensory (2nd Digit)  Wrist    5.1 <3.6 3.7 >15  Left Ulnar Anti Sensory (5th Digit)  Wrist    2.4 <3.1 26.1 >10  Right Ulnar Anti Sensory (5th Digit)  Wrist    2.1 <3.1 24.5 >10   Motor Summary Table   Site NR Onset (ms) Norm Onset  (ms) O-P Amp (mV) Norm O-P Amp Site1 Site2 Delta-0 (ms) Dist (cm) Vel (m/s) Norm Vel (m/s)  Left Median Motor (Abd Poll Brev)  Wrist    5.0 <4.0 4.5 >6 Elbow Wrist 4.2 25.0 60 >50  Elbow    9.2  4.5         Right Median Motor (Abd Poll Brev)  Wrist    5.3 <4.0 5.1 >6 Elbow Wrist 4.1 24.0 59 >50  Elbow    9.4  4.8         Left Ulnar Motor (Abd Dig Minimi)  Wrist    2.1 <3.1 10.6 >7 B Elbow Wrist 3.7 23.0 62 >50  B Elbow    5.8  9.7  A Elbow B Elbow 1.9 10.0 53 >50  A Elbow    7.7  9.2         Right Ulnar Motor (Abd Dig Minimi)  Wrist    2.3 <3.1 10.5 >7 B Elbow Wrist 3.7 23.0 62 >50  B Elbow    6.0  10.1  A Elbow B Elbow 1.9 10.0 53 >50  A Elbow    7.9  9.5          EMG   Side Muscle Ins Act Fibs Psw Fasc Number Recrt Dur Dur. Amp Amp. Poly Poly. Comment  Right 1stDorInt Nml Nml Nml Nml Nml Nml Nml Nml Nml Nml Nml Nml N/A  Right Abd Poll Brev Nml Nml Nml Nml 2- Rapid Many 1+ Some 1+ Nml Nml N/A  Right PronatorTeres Nml Nml Nml  Nml 1- Rapid Some 1+ Some 1+ Nml Nml N/A  Right Biceps Nml Nml Nml Nml 1- Rapid Few 1+ Few 1+ Nml Nml N/A  Right Triceps Nml Nml Nml Nml Nml Nml Nml Nml Nml Nml Nml Nml N/A  Right Deltoid Nml Nml Nml Nml Nml Nml Nml Nml Nml Nml Nml Nml N/A  Left 1stDorInt Nml Nml Nml Nml Nml Nml Nml Nml Nml Nml Nml Nml N/A  Left Abd Poll Brev Nml Nml Nml Nml 1- Rapid Some 1+ Some 1+ Nml Nml N/A  Left PronatorTeres Nml Nml Nml Nml Nml Nml Nml Nml Nml Nml Nml Nml N/A  Left Biceps Nml Nml Nml Nml Nml Nml Nml Nml Nml Nml Nml Nml N/A  Left Triceps Nml Nml Nml Nml Nml Nml Nml Nml Nml Nml Nml Nml N/A  Left Deltoid Nml Nml Nml Nml Nml Nml Nml Nml Nml Nml Nml Nml N/A      Waveforms:

## 2017-11-19 DIAGNOSIS — M79642 Pain in left hand: Secondary | ICD-10-CM | POA: Diagnosis not present

## 2017-11-19 DIAGNOSIS — M79641 Pain in right hand: Secondary | ICD-10-CM | POA: Diagnosis not present

## 2017-11-28 ENCOUNTER — Encounter: Payer: Self-pay | Admitting: Gynecologic Oncology

## 2017-11-28 ENCOUNTER — Inpatient Hospital Stay: Payer: BLUE CROSS/BLUE SHIELD | Attending: Gynecologic Oncology | Admitting: Gynecologic Oncology

## 2017-11-28 VITALS — BP 126/75 | HR 92 | Temp 98.6°F | Resp 18 | Ht 62.0 in | Wt 237.2 lb

## 2017-11-28 DIAGNOSIS — Z90710 Acquired absence of both cervix and uterus: Secondary | ICD-10-CM | POA: Insufficient documentation

## 2017-11-28 DIAGNOSIS — N888 Other specified noninflammatory disorders of cervix uteri: Secondary | ICD-10-CM | POA: Diagnosis not present

## 2017-11-28 DIAGNOSIS — N898 Other specified noninflammatory disorders of vagina: Secondary | ICD-10-CM | POA: Diagnosis not present

## 2017-11-28 DIAGNOSIS — C541 Malignant neoplasm of endometrium: Secondary | ICD-10-CM | POA: Insufficient documentation

## 2017-11-28 DIAGNOSIS — Z90722 Acquired absence of ovaries, bilateral: Secondary | ICD-10-CM | POA: Diagnosis not present

## 2017-11-28 NOTE — Patient Instructions (Addendum)
Discontinue megace treatment.  Dr Serita Grit office will contact you with the biopsy results.  Try monistat in the feminine hygiene aisle of the supermarket for symptoms of vaginal yeast.   Please contact Dr Serita Grit office in the summer at [phone] to schedule follow-up with her in 6 months (September, 2019).

## 2017-11-28 NOTE — Progress Notes (Signed)
Follow-up Note: Gyn-Onc  Consult was initially requested by Dr. Alden Hipp for the evaluation of April Scott 57 y.o. female  CC:  Chief Complaint  Patient presents with  . Endometrial adenocarcinoma [facility])    Assessment/Plan:  Ms. April Scott is a 57 y.o.  with stage IA grade 1 endometrial cancer with ITC's in the left SLN. Normal MSI testing. S/p 6 months of megace. Lesion at midline vaginal cuff (likely granulation tissue)  Reasonable to discontinue megace given symptoms. Will follow-up the biopsy result and if benign, no further action needed. If recurrence - would perform imaging then consider radiation.   I will see her back in 6 months.   HPI: Ms. April Scott is a 57 y.o.  gravida 2 para 0 reports intermittent vaginal spotting for approximately 6 months. The longest continuous flow lasted for approximately 3 weeks. Her last normal menstrual period was approximately one half years ago.  An endometrial biopsy collected on 06/2 27 2018 is notable for moderately differentiated endometrioid adenocarcinoma  (uterus sounded to 9 cm).  April Scott reports intermittent vaginal spotting since that procedure. Her history is notable for sending type 2 diabetes mellitus with a hemoglobin A1c of 7.2. The last time she checked a fasting blood sugar was 3 weeks ago in the value was 120.  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 Her weight loss stalled out at 72 pounds.   On 03/20/17 she underwent robotic assisted total hysterectomy, BSO, SLN biopsy. Surgery was uncomplicated. Postoperatively pathology revealed an 0.5cm tumor which was grade 1, with 0.2cm of 1.5cm myometrial invasion, no LVSI. ITC's were seen in the left pelvic SLN (common iliac).  Interval Hx:  She was prescribed megace for maintenance adjuvant therapy. This caused significant toxicity of weight gain, fatigue and hair loss. She developed  symptoms of carpal tunnel with planned surgery in march, 2019.  The patient denies vaginal bleeding or pelvic pains.  Review of Systems:  Constitutional  Feels well,  Cardiovascular  No chest pain, shortness of breath, or edema  Pulmonary  No cough or wheeze.  Gastro Intestinal  No nausea, vomitting, or diarrhoea. No bright red blood per rectum, no abdominal pain, change in bowel movement, or constipation.  Genito Urinary  No frequency, urgency, dysuria, Vaginal spotting  Musculo Skeletal  No myalgia, arthralgia, joint swelling or pain  Neurologic  No weakness, numbness, change in gait,  Psychology  No depression, anxiety, insomnia.    Current Meds:  Outpatient Encounter Medications as of 11/28/2017  Medication Sig  . atorvastatin (LIPITOR) 10 MG tablet Take 20 mg by mouth every evening.   . Biotin w/ Vitamins C & E (HAIR/SKIN/NAILS PO) Take 1 tablet by mouth every evening.  . empagliflozin (JARDIANCE) 10 MG TABS tablet Take 10 mg by mouth every evening.   . fluconazole (DIFLUCAN) 150 MG tablet   . gabapentin (NEURONTIN) 300 MG capsule Take 1 tablet at bedtime x 1 week, then increase to 1 tablet twice daily.  Marland Kitchen ibuprofen (ADVIL,MOTRIN) 200 MG tablet Take 600-800 mg by mouth 2 (two) times daily as needed for moderate pain.   Marland Kitchen JENTADUETO XR 01-999 MG TB24 Take 1 tablet by mouth every evening.   Marland Kitchen levothyroxine (SYNTHROID) 200 MCG tablet Take 1 tablet by mouth every evening  . lisinopril (PRINIVIL,ZESTRIL) 2.5 MG tablet Take 2.5 mg by mouth every evening.   Marland Kitchen omeprazole (PRILOSEC) 40 MG capsule Take 1 capsule (40 mg total) by mouth daily.  Marland Kitchen  VIIBRYD 20 MG TABS Take 1 tablet by mouth every evening.   . [DISCONTINUED] megestrol (MEGACE) 40 MG tablet Take 40 mg by mouth 2 (two) times daily.  Marland Kitchen aspirin EC 81 MG tablet Take 81 mg by mouth every evening.    Facility-Administered Encounter Medications as of 11/28/2017  Medication  . 0.9 %  sodium chloride infusion    Allergy: No  Known Allergies  Social Hx:   Social History   Socioeconomic History  . Marital status: Married    Spouse name: Not on file  . Number of children: 6  . Years of education: 54  . Highest education level: Not on file  Social Needs  . Financial resource strain: Not on file  . Food insecurity - worry: Not on file  . Food insecurity - inability: Not on file  . Transportation needs - medical: Not on file  . Transportation needs - non-medical: Not on file  Occupational History  . Occupation: HR    Employer: [employer]  Tobacco Use  . Smoking status: Never Smoker  . Smokeless tobacco: Never Used  Substance and Sexual Activity  . Alcohol use: No  . Drug use: No  . Sexual activity: Not on file  Other Topics Concern  . Not on file  Social History Narrative   Lives with husband in a one story home.  Has 6 children.  Works in [employer] for [employer].  Education: college.     Past Surgical Hx:  Past Surgical History:  Procedure Laterality Date  . colonscopy  2017 and 2012   with polyp removal  . LAPAROSCOPIC GASTRIC SLEEVE RESECTION  2012  . ROBOTIC ASSISTED TOTAL HYSTERECTOMY WITH BILATERAL SALPINGO OOPHERECTOMY Bilateral 03/20/2017   Procedure: XI ROBOTIC ASSISTED TOTAL HYSTERECTOMY WITH BILATERAL SALPINGO OOPHORECTOMY WITH SENTINAL LYMPH NODES;  Surgeon: Everitt Amber, MD;  Location: [facility];  Service: Gynecology;  Laterality: Bilateral;  . spleen repair  2012 after gastric sleeve    Past Medical Hx:  Past Medical History:  Diagnosis Date  . Abnormal uterine bleeding 3 weeks ago  . Anxiety   . Arthritis    hands, bulging discs lower back  . Colon polyps   . Depression   . Diabetes mellitus without complication ([place])    type 2  . Endometrial cancer ([place])   . GERD (gastroesophageal reflux disease)   . Hypercholesteremia   . Hypothyroidism   . Renal disorder   . Sleep apnea   . Thyroid disease   Mammogram 2017 within normal limits colonoscopy in 2017 removal of 2 benign  polyps colonoscopy 5 years prior to that also associated removal of polyps  Past Gynecological History:   Gravida 2 para 0 last normal menstrual period 1-1/2 years ago menarche at age 21 irregular menses throughout adulthood   Family Hx: Maternal aunt with ovarian cancer diagnosis 39 years of age father with leukemia in early adulthood and lung cancer in later adulthood  Vitals:  Blood pressure 126/75, pulse 92, temperature 98.6 F (37 C), temperature source Oral, resp. rate 18, height 5' 2" (1.575 m), weight 237 lb 3.2 oz (107.6 kg), last menstrual period 02/19/2017, SpO2 99 %. Body mass index is 43.38 kg/m.   Physical Exam: WD in NAD Neck  Supple NROM, without any enlargements.  Lymph Node Survey No cervical supraclavicular or inguinal adenopathy Cardiovascular  Pulse normal rate, regularity and rhythm.  Lungs  Clear to auscultation bilaterally, without wheezes/crackles/rhonchi. Good air movement.  Psychiatry  Alert and oriented  appropriate mood affect speech and reasoning. Abdomen  Normoactive bowel sounds, abdomen soft, non-tender. Very obese. Well healed incisions. Back No CVA tenderness Genito Urinary  Vaginal cuff with 52m erythematous papule in the midline cuff. No masses deeper in pelvis.  No bleeding. No lesions Good tone, no masses no cul de sac nodularity.  Extremities  No bilateral cyanosis, clubbing or edema.  Procedure Note: vaginal biopsy Preop Dx: history of endometrial cancer, vaginal lesion Postop Dx: same Procedure: vaginal biopsy Surgeon: EEveritt AmberSpecimen: vaginal apex biopsy Complications: none EBL: minimal Procedure: Verbal consent was obtained timeout was performed.  A speculum was inserted into the vagina and a vaginal lesion that measured 2 mm was seen at the midline vaginal cuff.  It was hyperemic.  It was grasped and removed in its entirety with the biopsy forcep.  Was sent for permanent pathology.  Hemostasis was achieved with silver  nitrate.  Patient tolerated procedure well.  EThereasa Solo MD 11/28/2017, 2:58 PM

## 2017-12-03 ENCOUNTER — Telehealth: Payer: Self-pay | Admitting: Gynecologic Oncology

## 2017-12-03 NOTE — Telephone Encounter (Signed)
Patient informed of biopsy results.  No concerns voiced.  Advised to call for any needs. 

## 2017-12-03 NOTE — Telephone Encounter (Signed)
-----   Message from Everitt Amber, MD sent at 11/30/2017  5:59 PM EDT ----- Can we please let her know that there is no cancer on the biopsy and that the lesion was scar tissue.  Thereasa Solo, MD

## 2017-12-05 DIAGNOSIS — G5602 Carpal tunnel syndrome, left upper limb: Secondary | ICD-10-CM | POA: Diagnosis not present

## 2017-12-17 DIAGNOSIS — G5602 Carpal tunnel syndrome, left upper limb: Secondary | ICD-10-CM | POA: Diagnosis not present

## 2018-01-01 ENCOUNTER — Other Ambulatory Visit: Payer: Self-pay

## 2018-01-01 DIAGNOSIS — R948 Abnormal results of function studies of other organs and systems: Secondary | ICD-10-CM

## 2018-01-01 MED ORDER — OMEPRAZOLE 40 MG PO CPDR: 40.0000 mg | DELAYED_RELEASE_CAPSULE | Freq: Every day | ORAL | 1 refills | Status: AC

## 2018-01-30 DIAGNOSIS — I10 Essential (primary) hypertension: Secondary | ICD-10-CM | POA: Diagnosis not present

## 2018-01-30 DIAGNOSIS — C541 Malignant neoplasm of endometrium: Secondary | ICD-10-CM | POA: Diagnosis not present

## 2018-01-30 DIAGNOSIS — E1165 Type 2 diabetes mellitus with hyperglycemia: Secondary | ICD-10-CM | POA: Diagnosis not present

## 2018-01-30 DIAGNOSIS — Z9884 Bariatric surgery status: Secondary | ICD-10-CM | POA: Diagnosis not present

## 2018-02-18 ENCOUNTER — Telehealth: Payer: Self-pay

## 2018-02-18 NOTE — Telephone Encounter (Signed)
Returned pt's call regarding she would like a referral for endocrinologist at [facility].  Pt reports she is no longer seeing her endocrinologist at [facility] and would like referral to [facility] - just needs one from provider who knows she has diabetes, pt verbalized.  Per Joylene John, NP,prefer pt to call her PCP- pt said [facility] is her PCP and endocrinologist. Pt decided she is going to call and make an appt with a new PCP and then let them make her the endocrinologist appt.  Said if she has to, she will go thru a urgent care, diabetic  meds almost due for refill, and then the urgent care can refill and refer her to a PCP.  No other needs per pt.  I encouraged her to call us back if we can be of more assistance due to importance of having a PCP and not running out of her diabetic meds. Pt voiced understanding.

## 2018-02-20 ENCOUNTER — Ambulatory Visit: Payer: BLUE CROSS/BLUE SHIELD | Admitting: Neurology

## 2018-03-06 DIAGNOSIS — G5601 Carpal tunnel syndrome, right upper limb: Secondary | ICD-10-CM | POA: Diagnosis not present

## 2018-03-07 DIAGNOSIS — E039 Hypothyroidism, unspecified: Secondary | ICD-10-CM | POA: Diagnosis not present

## 2018-03-07 DIAGNOSIS — E785 Hyperlipidemia, unspecified: Secondary | ICD-10-CM | POA: Diagnosis not present

## 2018-03-07 DIAGNOSIS — E119 Type 2 diabetes mellitus without complications: Secondary | ICD-10-CM | POA: Diagnosis not present

## 2018-03-07 DIAGNOSIS — G4730 Sleep apnea, unspecified: Secondary | ICD-10-CM | POA: Diagnosis not present

## 2018-03-13 DIAGNOSIS — N760 Acute vaginitis: Secondary | ICD-10-CM | POA: Diagnosis not present

## 2018-03-13 DIAGNOSIS — Z6841 Body Mass Index (BMI) 40.0 and over, adult: Secondary | ICD-10-CM | POA: Diagnosis not present

## 2018-03-13 DIAGNOSIS — N898 Other specified noninflammatory disorders of vagina: Secondary | ICD-10-CM | POA: Diagnosis not present

## 2018-03-18 DIAGNOSIS — G5601 Carpal tunnel syndrome, right upper limb: Secondary | ICD-10-CM | POA: Diagnosis not present

## 2018-03-20 DIAGNOSIS — E119 Type 2 diabetes mellitus without complications: Secondary | ICD-10-CM | POA: Diagnosis not present

## 2018-03-20 DIAGNOSIS — E039 Hypothyroidism, unspecified: Secondary | ICD-10-CM | POA: Diagnosis not present

## 2018-04-15 DIAGNOSIS — M25511 Pain in right shoulder: Secondary | ICD-10-CM | POA: Diagnosis not present

## 2018-04-22 ENCOUNTER — Telehealth: Payer: Self-pay | Admitting: *Deleted

## 2018-04-22 NOTE — Telephone Encounter (Signed)
Called, left the patient a message to the office back. Offered the patient a follow up appt on September 30th at 11:45am. Patient to call the office back to confirm appt

## 2018-04-23 ENCOUNTER — Telehealth: Payer: Self-pay | Admitting: *Deleted

## 2018-04-23 NOTE — Telephone Encounter (Signed)
Returned the patient's call and scheduled the patient for 9/30 at 11:45am

## 2018-04-29 DIAGNOSIS — G4730 Sleep apnea, unspecified: Secondary | ICD-10-CM | POA: Diagnosis not present

## 2018-04-29 DIAGNOSIS — E039 Hypothyroidism, unspecified: Secondary | ICD-10-CM | POA: Diagnosis not present

## 2018-04-29 DIAGNOSIS — E119 Type 2 diabetes mellitus without complications: Secondary | ICD-10-CM | POA: Diagnosis not present

## 2018-04-29 DIAGNOSIS — Z6841 Body Mass Index (BMI) 40.0 and over, adult: Secondary | ICD-10-CM | POA: Diagnosis not present

## 2018-05-08 ENCOUNTER — Ambulatory Visit: Payer: BLUE CROSS/BLUE SHIELD | Admitting: Neurology

## 2018-06-10 ENCOUNTER — Encounter: Payer: Self-pay | Admitting: Gynecologic Oncology

## 2018-06-10 ENCOUNTER — Inpatient Hospital Stay: Payer: BLUE CROSS/BLUE SHIELD | Attending: Gynecologic Oncology | Admitting: Gynecologic Oncology

## 2018-06-10 VITALS — BP 138/77 | HR 71 | Temp 98.2°F | Resp 18 | Ht 61.0 in | Wt 234.2 lb

## 2018-06-10 DIAGNOSIS — Z90722 Acquired absence of ovaries, bilateral: Secondary | ICD-10-CM | POA: Insufficient documentation

## 2018-06-10 DIAGNOSIS — Z90710 Acquired absence of both cervix and uterus: Secondary | ICD-10-CM | POA: Insufficient documentation

## 2018-06-10 DIAGNOSIS — C541 Malignant neoplasm of endometrium: Secondary | ICD-10-CM | POA: Diagnosis not present

## 2018-06-10 DIAGNOSIS — Z8542 Personal history of malignant neoplasm of other parts of uterus: Secondary | ICD-10-CM | POA: Insufficient documentation

## 2018-06-10 NOTE — Progress Notes (Signed)
Follow-up Note: Gyn-Onc  Consult was initially requested by Dr. Alden Hipp for the evaluation of April Scott 57 y.o. female  CC:  Chief Complaint  Patient presents with  . history of endometrial cancer    Assessment/Plan:  Ms. April Scott is a 57 y.o.  with stage IA grade 1 endometrial cancer with ITC's in the left SLN. Normal MSI testing. S/p 6 months of megace.  I will see her back in 6 months.   HPI: Ms. April Scott is a 57 y.o.  gravida 2 para 0 reports intermittent vaginal spotting for approximately 6 months. The longest continuous flow lasted for approximately 3 weeks. Her last normal menstrual period was approximately one half years ago.  An endometrial biopsy collected on 06/2 27 2018 is notable for moderately differentiated endometrioid adenocarcinoma  (uterus sounded to 9 cm).  Rilya Panek reports intermittent vaginal spotting since that procedure. Her history is notable for sending type 2 diabetes mellitus with a hemoglobin A1c of 7.2. The last time she checked a fasting blood sugar was 3 weeks ago in the value was 120.  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 Her weight loss stalled out at 72 pounds.   On 03/20/17 she underwent robotic assisted total hysterectomy, BSO, SLN biopsy. Surgery was uncomplicated. Postoperatively pathology revealed an 0.5cm tumor which was grade 1, with 0.2cm of 1.5cm myometrial invasion, no LVSI. ITC's were seen in the left pelvic SLN (common iliac).  Interval Hx:  She was prescribed megace for maintenance adjuvant therapy. This caused significant toxicity of weight gain, fatigue and hair loss. She developed symptoms of carpal tunnel s/p surgery in march, 2019. Biopsy of vaginal granulation tissue in March, 2019 was benign.   The patient denies vaginal bleeding or pelvic pains.  Review of Systems:  Constitutional  Feels well,  Cardiovascular  No  chest pain, shortness of breath, or edema  Pulmonary  No cough or wheeze.  Gastro Intestinal  No nausea, vomitting, or diarrhoea. No bright red blood per rectum, no abdominal pain, change in bowel movement, or constipation.  Genito Urinary  No frequency, urgency, dysuria, Vaginal spotting  Musculo Skeletal  No myalgia, arthralgia, joint swelling or pain  Neurologic  No weakness, numbness, change in gait,  Psychology  No depression, anxiety, insomnia.    Current Meds:  Outpatient Encounter Medications as of 06/10/2018  Medication Sig  . atorvastatin (LIPITOR) 10 MG tablet Take 20 mg by mouth every evening.   . Biotin w/ Vitamins C & E (HAIR/SKIN/NAILS PO) Take 1 tablet by mouth every evening.  . empagliflozin (JARDIANCE) 10 MG TABS tablet Take 10 mg by mouth every evening.   . gabapentin (NEURONTIN) 300 MG capsule Take 1 tablet at bedtime x 1 week, then increase to 1 tablet twice daily.  Marland Kitchen levothyroxine (SYNTHROID) 200 MCG tablet Take 1 tablet by mouth every evening  . lisinopril (PRINIVIL,ZESTRIL) 2.5 MG tablet Take 2.5 mg by mouth every evening.   Marland Kitchen omeprazole (PRILOSEC) 40 MG capsule Take 1 capsule (40 mg total) by mouth daily.  Marland Kitchen VIIBRYD 20 MG TABS Take 1 tablet by mouth every evening.   Marland Kitchen ibuprofen (ADVIL,MOTRIN) 200 MG tablet Take 600-800 mg by mouth 2 (two) times daily as needed for moderate pain.   . metFORMIN (GLUCOPHAGE-XR) 500 MG 24 hr tablet Take 1,000 mg by mouth 2 (two) times daily.  Marland Kitchen OZEMPIC, 0.25 OR 0.5 MG/DOSE, 2 MG/1.5ML SOPN   . [DISCONTINUED] aspirin EC 81  MG tablet Take 81 mg by mouth every evening.   . [DISCONTINUED] fluconazole (DIFLUCAN) 150 MG tablet   . [DISCONTINUED] JENTADUETO XR 01-999 MG TB24 Take 1 tablet by mouth every evening.    Facility-Administered Encounter Medications as of 06/10/2018  Medication  . 0.9 %  sodium chloride infusion    Allergy: No Known Allergies  Social Hx:   Social History   Socioeconomic History  . Marital status:  Married    Spouse name: Not on file  . Number of children: 6  . Years of education: 67  . Highest education level: Not on file  Occupational History  . Occupation: HR    Employer: [employer]  Social Needs  . Financial resource strain: Not on file  . Food insecurity:    Worry: Not on file    Inability: Not on file  . Transportation needs:    Medical: Not on file    Non-medical: Not on file  Tobacco Use  . Smoking status: Never Smoker  . Smokeless tobacco: Never Used  Substance and Sexual Activity  . Alcohol use: No  . Drug use: No  . Sexual activity: Not on file  Lifestyle  . Physical activity:    Days per week: Not on file    Minutes per session: Not on file  . Stress: Not on file  Relationships  . Social connections:    Talks on phone: Not on file    Gets together: Not on file    Attends religious service: Not on file    Active member of club or organization: Not on file    Attends meetings of clubs or organizations: Not on file    Relationship status: Not on file  . Intimate partner violence:    Fear of current or ex partner: Not on file    Emotionally abused: Not on file    Physically abused: Not on file    Forced sexual activity: Not on file  Other Topics Concern  . Not on file  Social History Narrative   Lives with husband in a one story home.  Has 6 children.  Works in [employer] for [employer].  Education: college.     Past Surgical Hx:  Past Surgical History:  Procedure Laterality Date  . colonscopy  2017 and 2012   with polyp removal  . LAPAROSCOPIC GASTRIC SLEEVE RESECTION  2012  . ROBOTIC ASSISTED TOTAL HYSTERECTOMY WITH BILATERAL SALPINGO OOPHERECTOMY Bilateral 03/20/2017   Procedure: XI ROBOTIC ASSISTED TOTAL HYSTERECTOMY WITH BILATERAL SALPINGO OOPHORECTOMY WITH SENTINAL LYMPH NODES;  Surgeon: Everitt Amber, MD;  Location: [facility];  Service: Gynecology;  Laterality: Bilateral;  . spleen repair  2012 after gastric sleeve    Past Medical Hx:  Past  Medical History:  Diagnosis Date  . Abnormal uterine bleeding 3 weeks ago  . Anxiety   . Arthritis    hands, bulging discs lower back  . Colon polyps   . Depression   . Diabetes mellitus without complication ([place])    type 2  . Endometrial cancer ([place])   . GERD (gastroesophageal reflux disease)   . Hypercholesteremia   . Hypothyroidism   . Renal disorder   . Sleep apnea   . Thyroid disease   Mammogram 2017 within normal limits colonoscopy in 2017 removal of 2 benign polyps colonoscopy 5 years prior to that also associated removal of polyps  Past Gynecological History:   Gravida 2 para 0 last normal menstrual period 1-1/2 years ago menarche  at age 69 irregular menses throughout adulthood   Family Hx: Maternal aunt with ovarian cancer diagnosis 34 years of age father with leukemia in early adulthood and lung cancer in later adulthood  Vitals:  Blood pressure 138/77, pulse 71, temperature 98.2 F (36.8 C), temperature source Oral, resp. rate 18, height '5\' 1"'$  (1.549 m), weight 234 lb 4 oz (106.3 kg), last menstrual period 02/19/2017, SpO2 98 %. Body mass index is 44.26 kg/m.   Physical Exam: WD in NAD Neck  Supple NROM, without any enlargements.  Lymph Node Survey No cervical supraclavicular or inguinal adenopathy Cardiovascular  Pulse normal rate, regularity and rhythm.  Lungs  Clear to auscultation bilaterally, without wheezes/crackles/rhonchi. Good air movement.  Psychiatry  Alert and oriented appropriate mood affect speech and reasoning. Abdomen  Normoactive bowel sounds, abdomen soft, non-tender. Very obese. Well healed incisions. Back No CVA tenderness Genito Urinary  Vaginal cuff smooth with no lesions. No masses deeper in pelvis.  No bleeding. No lesions Good tone, no masses no cul de sac nodularity.  Extremities  No bilateral cyanosis, clubbing or edema.   Thereasa Solo, MD 06/10/2018, 12:21 PM

## 2018-06-10 NOTE — Patient Instructions (Signed)
Please notify Dr Denman George at phone number [phone] if you notice vaginal bleeding, new pelvic or abdominal pains, bloating, feeling full easy, or a change in bladder or bowel function.   Please return to see Dr Denman George in March/April 2020 (you can contact her office after October, 2019 to schedule this).

## 2018-06-25 DIAGNOSIS — E039 Hypothyroidism, unspecified: Secondary | ICD-10-CM | POA: Diagnosis not present

## 2018-06-25 DIAGNOSIS — E559 Vitamin D deficiency, unspecified: Secondary | ICD-10-CM | POA: Diagnosis not present

## 2018-06-25 DIAGNOSIS — E119 Type 2 diabetes mellitus without complications: Secondary | ICD-10-CM | POA: Diagnosis not present

## 2018-07-02 DIAGNOSIS — G4730 Sleep apnea, unspecified: Secondary | ICD-10-CM | POA: Diagnosis not present

## 2018-07-02 DIAGNOSIS — E039 Hypothyroidism, unspecified: Secondary | ICD-10-CM | POA: Diagnosis not present

## 2018-07-02 DIAGNOSIS — Z6841 Body Mass Index (BMI) 40.0 and over, adult: Secondary | ICD-10-CM | POA: Diagnosis not present

## 2018-07-02 DIAGNOSIS — E119 Type 2 diabetes mellitus without complications: Secondary | ICD-10-CM | POA: Diagnosis not present

## 2018-08-28 DIAGNOSIS — S0093XA Contusion of unspecified part of head, initial encounter: Secondary | ICD-10-CM | POA: Diagnosis not present

## 2018-08-28 DIAGNOSIS — R4182 Altered mental status, unspecified: Secondary | ICD-10-CM | POA: Diagnosis not present

## 2018-08-28 DIAGNOSIS — M546 Pain in thoracic spine: Secondary | ICD-10-CM | POA: Diagnosis not present

## 2018-08-28 DIAGNOSIS — M542 Cervicalgia: Secondary | ICD-10-CM | POA: Diagnosis not present

## 2018-08-28 DIAGNOSIS — M5489 Other dorsalgia: Secondary | ICD-10-CM | POA: Diagnosis not present

## 2018-08-28 DIAGNOSIS — R51 Headache: Secondary | ICD-10-CM | POA: Diagnosis not present

## 2018-08-28 DIAGNOSIS — S299XXA Unspecified injury of thorax, initial encounter: Secondary | ICD-10-CM | POA: Diagnosis not present

## 2018-08-28 DIAGNOSIS — S3992XA Unspecified injury of lower back, initial encounter: Secondary | ICD-10-CM | POA: Diagnosis not present

## 2018-08-28 DIAGNOSIS — S199XXA Unspecified injury of neck, initial encounter: Secondary | ICD-10-CM | POA: Diagnosis not present

## 2018-08-28 DIAGNOSIS — E119 Type 2 diabetes mellitus without complications: Secondary | ICD-10-CM | POA: Diagnosis not present

## 2018-08-28 DIAGNOSIS — Z7984 Long term (current) use of oral hypoglycemic drugs: Secondary | ICD-10-CM | POA: Diagnosis not present

## 2018-08-28 DIAGNOSIS — Z79899 Other long term (current) drug therapy: Secondary | ICD-10-CM | POA: Diagnosis not present

## 2018-08-28 DIAGNOSIS — Z8572 Personal history of non-Hodgkin lymphomas: Secondary | ICD-10-CM | POA: Diagnosis not present

## 2018-08-28 DIAGNOSIS — S0003XA Contusion of scalp, initial encounter: Secondary | ICD-10-CM | POA: Diagnosis not present

## 2018-09-27 ENCOUNTER — Telehealth: Payer: Self-pay

## 2018-09-27 NOTE — Telephone Encounter (Signed)
Incoming call from pt to schedule f/u appt with Dr Denman George for March 2020- appt made for 3-20 at 2:15 pm.  Pt agreeable. No other needs per pt at this time.

## 2018-10-11 DIAGNOSIS — L821 Other seborrheic keratosis: Secondary | ICD-10-CM | POA: Diagnosis not present

## 2018-10-14 DIAGNOSIS — E119 Type 2 diabetes mellitus without complications: Secondary | ICD-10-CM | POA: Diagnosis not present

## 2018-10-14 DIAGNOSIS — E039 Hypothyroidism, unspecified: Secondary | ICD-10-CM | POA: Diagnosis not present

## 2018-10-14 DIAGNOSIS — E559 Vitamin D deficiency, unspecified: Secondary | ICD-10-CM | POA: Diagnosis not present

## 2018-10-21 DIAGNOSIS — G4730 Sleep apnea, unspecified: Secondary | ICD-10-CM | POA: Diagnosis not present

## 2018-10-21 DIAGNOSIS — E039 Hypothyroidism, unspecified: Secondary | ICD-10-CM | POA: Diagnosis not present

## 2018-10-21 DIAGNOSIS — E119 Type 2 diabetes mellitus without complications: Secondary | ICD-10-CM | POA: Diagnosis not present

## 2018-10-21 DIAGNOSIS — Z6841 Body Mass Index (BMI) 40.0 and over, adult: Secondary | ICD-10-CM | POA: Diagnosis not present

## 2018-11-26 ENCOUNTER — Telehealth: Payer: Self-pay | Admitting: *Deleted

## 2018-11-26 NOTE — Telephone Encounter (Signed)
Called and left a message to call the office regarding her appt on Friday. Need to move her appt from Friday to the end of April

## 2018-11-26 NOTE — Telephone Encounter (Signed)
Patient returned call and left a voicemail to call her back. Called and rescheduled the patient's appt to May

## 2018-11-29 ENCOUNTER — Ambulatory Visit: Payer: BLUE CROSS/BLUE SHIELD | Admitting: Gynecologic Oncology

## 2019-01-08 ENCOUNTER — Telehealth: Payer: Self-pay | Admitting: *Deleted

## 2019-01-08 NOTE — Telephone Encounter (Signed)
Spoke with April Scott and her pain is in her neck and shoulders and radiates down arms. She does have trouble sleeping due to the pain. Onset 4 weeks ago. Pt working from home. Computer may be not be ergonomic. She has been more active then she has been being at home more. She is not having any vaginal bleeding or abdominal pain/cramping. Reviewed with Dr. Denman George. 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 Dr. Denman George suggests that she contact her PCP for further evaluation of neck and arm pain. Pt. Will pursue the visit with PCP and r/s her visit with Dr. Denman George to 02-24-2019.

## 2019-01-08 NOTE — Telephone Encounter (Signed)
Called and spoke with the patient regarding her appt for May 6th. Attempted to change the appt to a virtual visit, patient declined. Patient stated " I think I will need a MRI. I'm having entire body pain, it's constant and pain level is a 10. It's been going on for 4 weeks." Explained that I will give the message to [facility] APP and call her back.

## 2019-01-15 ENCOUNTER — Ambulatory Visit: Payer: Self-pay | Admitting: Gynecologic Oncology

## 2019-01-29 DIAGNOSIS — R413 Other amnesia: Secondary | ICD-10-CM | POA: Diagnosis not present

## 2019-01-29 DIAGNOSIS — E559 Vitamin D deficiency, unspecified: Secondary | ICD-10-CM | POA: Diagnosis not present

## 2019-01-29 DIAGNOSIS — E039 Hypothyroidism, unspecified: Secondary | ICD-10-CM | POA: Diagnosis not present

## 2019-01-29 DIAGNOSIS — F419 Anxiety disorder, unspecified: Secondary | ICD-10-CM | POA: Diagnosis not present

## 2019-01-29 DIAGNOSIS — Z0000 Encounter for general adult medical examination without abnormal findings: Secondary | ICD-10-CM | POA: Diagnosis not present

## 2019-01-29 DIAGNOSIS — E119 Type 2 diabetes mellitus without complications: Secondary | ICD-10-CM | POA: Diagnosis not present

## 2019-02-24 ENCOUNTER — Other Ambulatory Visit: Payer: Self-pay

## 2019-02-24 ENCOUNTER — Inpatient Hospital Stay: Payer: BC Managed Care – PPO | Attending: Gynecologic Oncology | Admitting: Gynecologic Oncology

## 2019-02-24 ENCOUNTER — Encounter: Payer: Self-pay | Admitting: Gynecologic Oncology

## 2019-02-24 VITALS — BP 131/63 | HR 85 | Temp 98.5°F | Resp 18 | Ht 61.0 in | Wt 224.9 lb

## 2019-02-24 DIAGNOSIS — C541 Malignant neoplasm of endometrium: Secondary | ICD-10-CM | POA: Diagnosis not present

## 2019-02-24 DIAGNOSIS — Z9884 Bariatric surgery status: Secondary | ICD-10-CM | POA: Diagnosis not present

## 2019-02-24 DIAGNOSIS — E039 Hypothyroidism, unspecified: Secondary | ICD-10-CM | POA: Insufficient documentation

## 2019-02-24 DIAGNOSIS — E7800 Pure hypercholesterolemia, unspecified: Secondary | ICD-10-CM | POA: Insufficient documentation

## 2019-02-24 DIAGNOSIS — Z79899 Other long term (current) drug therapy: Secondary | ICD-10-CM | POA: Insufficient documentation

## 2019-02-24 DIAGNOSIS — Z90710 Acquired absence of both cervix and uterus: Secondary | ICD-10-CM | POA: Insufficient documentation

## 2019-02-24 DIAGNOSIS — B373 Candidiasis of vulva and vagina: Secondary | ICD-10-CM

## 2019-02-24 DIAGNOSIS — Z90722 Acquired absence of ovaries, bilateral: Secondary | ICD-10-CM | POA: Diagnosis not present

## 2019-02-24 DIAGNOSIS — E669 Obesity, unspecified: Secondary | ICD-10-CM | POA: Diagnosis not present

## 2019-02-24 DIAGNOSIS — F329 Major depressive disorder, single episode, unspecified: Secondary | ICD-10-CM | POA: Insufficient documentation

## 2019-02-24 DIAGNOSIS — B3731 Acute candidiasis of vulva and vagina: Secondary | ICD-10-CM

## 2019-02-24 DIAGNOSIS — K219 Gastro-esophageal reflux disease without esophagitis: Secondary | ICD-10-CM | POA: Insufficient documentation

## 2019-02-24 DIAGNOSIS — Z7984 Long term (current) use of oral hypoglycemic drugs: Secondary | ICD-10-CM | POA: Diagnosis not present

## 2019-02-24 DIAGNOSIS — E119 Type 2 diabetes mellitus without complications: Secondary | ICD-10-CM | POA: Diagnosis not present

## 2019-02-24 MED ORDER — FLUCONAZOLE 100 MG PO TABS: 150.0000 mg | ORAL_TABLET | Freq: Once | ORAL | 5 refills | Status: AC

## 2019-02-24 NOTE — Progress Notes (Signed)
Follow-up Note: Gyn-Onc  Consult was initially requested by Dr. Alden Hipp for the evaluation of April Scott 58 y.o. female  CC:  Chief Complaint  Patient presents with  . Endometrial cancer [facility])    Assessment/Plan:  April Scott is a 58 y.o.  with stage IA grade 1 endometrial cancer with ITC's in the left SLN. Normal MSI testing. S/p 6 months of megace.  I will see her back in 6 months.  Diflucan Rx given for recurrent yeast infections - counseled regarding role of diabetes and obesity in causing this.    HPI: April Scott is a 57 y.o.  gravida 2 para 0 reports intermittent vaginal spotting for approximately 6 months. The longest continuous flow lasted for approximately 3 weeks. Her last normal menstrual period was approximately one half years ago.  An endometrial biopsy collected on 06/2 27 2018 is notable for moderately differentiated endometrioid adenocarcinoma  (uterus sounded to 9 cm).  April Scott reports intermittent vaginal spotting since that procedure. Her history is notable for sending type 2 diabetes mellitus with a hemoglobin A1c of 7.2. The last time she checked a fasting blood sugar was 3 weeks ago in the value was 120.  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 Her weight loss stalled out at 72 pounds.   On 03/20/17 she underwent robotic assisted total hysterectomy, BSO, SLN biopsy. Surgery was uncomplicated. Postoperatively pathology revealed an 0.5cm tumor which was grade 1, with 0.2cm of 1.5cm myometrial invasion, no LVSI. ITC's were seen in the left pelvic SLN (common iliac).  Interval Hx:  She was prescribed megace for maintenance adjuvant therapy. This caused significant toxicity of weight gain, fatigue and hair loss. She developed symptoms of carpal tunnel s/p surgery in march, 2019. Biopsy of vaginal granulation tissue in March, 2019 was benign.   The patient  denies vaginal bleeding or pelvic pains.  She reports recurrent yeast infections refractory to monistat.  We discussed survivorship issues including: anxiety regarding cancer risk, genetic risk for cancer, skin cancer avoidance, review on ongoing surveillance plan.   Review of Systems:  Constitutional  Feels well,  Cardiovascular  No chest pain, shortness of breath, or edema  Pulmonary  No cough or wheeze.  Gastro Intestinal  No nausea, vomitting, or diarrhoea. No bright red blood per rectum, no abdominal pain, change in bowel movement, or constipation.  Genito Urinary  No frequency, urgency, dysuria, Vaginal spotting  Musculo Skeletal  No myalgia, arthralgia, joint swelling or pain  Neurologic  No weakness, numbness, change in gait,  Psychology  No depression, anxiety, insomnia.    Current Meds:  Outpatient Encounter Medications as of 02/24/2019  Medication Sig  . Biotin w/ Vitamins C & E (HAIR/SKIN/NAILS PO) Take 1 tablet by mouth every evening.  . empagliflozin (JARDIANCE) 10 MG TABS tablet Take 10 mg by mouth every evening.   Marland Kitchen ibuprofen (ADVIL,MOTRIN) 200 MG tablet Take 600-800 mg by mouth 2 (two) times daily as needed for moderate pain.   Marland Kitchen levothyroxine (SYNTHROID) 200 MCG tablet Take 1 tablet by mouth every evening  . lisinopril (PRINIVIL,ZESTRIL) 2.5 MG tablet Take 2.5 mg by mouth every evening.   . metFORMIN (GLUCOPHAGE-XR) 500 MG 24 hr tablet Take 1,000 mg by mouth 2 (two) times daily.  . Multiple Vitamins-Minerals (VITAMIN D3 COMPLETE PO) Take 1,000 Units by mouth daily.  . Omega-3 Fatty Acids (FISH OIL) 1000 MG CAPS Take 1 capsule by mouth daily.  Marland Kitchen omeprazole (  PRILOSEC) 40 MG capsule Take 1 capsule (40 mg total) by mouth daily.  Marland Kitchen OZEMPIC, 0.25 OR 0.5 MG/DOSE, 2 MG/1.5ML SOPN   . VIIBRYD 20 MG TABS Take 1 tablet by mouth every evening.   Marland Kitchen atorvastatin (LIPITOR) 20 MG tablet   . [DISCONTINUED] atorvastatin (LIPITOR) 10 MG tablet Take 20 mg by mouth every evening.    . [DISCONTINUED] gabapentin (NEURONTIN) 300 MG capsule Take 1 tablet at bedtime x 1 week, then increase to 1 tablet twice daily.   Facility-Administered Encounter Medications as of 02/24/2019  Medication  . 0.9 %  sodium chloride infusion    Allergy: No Known Allergies  Social Hx:   Social History   Socioeconomic History  . Marital status: Married    Spouse name: Not on file  . Number of children: 6  . Years of education: 44  . Highest education level: Not on file  Occupational History  . Occupation: HR    Employer: [employer]  Social Needs  . Financial resource strain: Not on file  . Food insecurity    Worry: Not on file    Inability: Not on file  . Transportation needs    Medical: Not on file    Non-medical: Not on file  Tobacco Use  . Smoking status: Never Smoker  . Smokeless tobacco: Never Used  Substance and Sexual Activity  . Alcohol use: No  . Drug use: No  . Sexual activity: Not on file  Lifestyle  . Physical activity    Days per week: Not on file    Minutes per session: Not on file  . Stress: Not on file  Relationships  . Social Herbalist on phone: Not on file    Gets together: Not on file    Attends religious service: Not on file    Active member of club or organization: Not on file    Attends meetings of clubs or organizations: Not on file    Relationship status: Not on file  . Intimate partner violence    Fear of current or ex partner: Not on file    Emotionally abused: Not on file    Physically abused: Not on file    Forced sexual activity: Not on file  Other Topics Concern  . Not on file  Social History Narrative   Lives with husband in a one story home.  Has 6 children.  Works in [employer] for [employer].  Education: college.     Past Surgical Hx:  Past Surgical History:  Procedure Laterality Date  . colonscopy  2017 and 2012   with polyp removal  . LAPAROSCOPIC GASTRIC SLEEVE RESECTION  2012  . ROBOTIC ASSISTED TOTAL  HYSTERECTOMY WITH BILATERAL SALPINGO OOPHERECTOMY Bilateral 03/20/2017   Procedure: XI ROBOTIC ASSISTED TOTAL HYSTERECTOMY WITH BILATERAL SALPINGO OOPHORECTOMY WITH SENTINAL LYMPH NODES;  Surgeon: Everitt Amber, MD;  Location: [facility];  Service: Gynecology;  Laterality: Bilateral;  . spleen repair  2012 after gastric sleeve    Past Medical Hx:  Past Medical History:  Diagnosis Date  . Abnormal uterine bleeding 3 weeks ago  . Anxiety   . Arthritis    hands, bulging discs lower back  . Colon polyps   . Depression   . Diabetes mellitus without complication ([place])    type 2  . Endometrial cancer ([place])   . GERD (gastroesophageal reflux disease)   . Hypercholesteremia   . Hypothyroidism   . Renal disorder   . Sleep  apnea   . Thyroid disease   Mammogram 2017 within normal limits colonoscopy in 2017 removal of 2 benign polyps colonoscopy 5 years prior to that also associated removal of polyps  Past Gynecological History:   Gravida 2 para 0 last normal menstrual period 1-1/2 years ago menarche at age 53 irregular menses throughout adulthood   Family Hx: Maternal aunt with ovarian cancer diagnosis 93 years of age father with leukemia in early adulthood and lung cancer in later adulthood  Vitals:  Blood pressure 131/63, pulse 85, temperature 98.5 F (36.9 C), temperature source Oral, resp. rate 18, height '5\' 1"'$  (1.549 m), weight 224 lb 14.4 oz (102 kg), last menstrual period 02/19/2017, SpO2 97 %. Body mass index is 42.49 kg/m.   Physical Exam: WD in NAD Neck  Supple NROM, without any enlargements.  Lymph Node Survey No cervical supraclavicular or inguinal adenopathy Cardiovascular  Pulse normal rate, regularity and rhythm.  Lungs  Clear to auscultation bilaterally, without wheezes/crackles/rhonchi. Good air movement.  Psychiatry  Alert and oriented appropriate mood affect speech and reasoning. Abdomen  Normoactive bowel sounds, abdomen soft, non-tender. Very obese. Well healed  incisions. Back No CVA tenderness Genito Urinary  Vaginal cuff smooth with no lesions. No masses deeper in pelvis.  No bleeding. No lesions Good tone, no masses no cul de sac nodularity.  Extremities  No bilateral cyanosis, clubbing or edema.   Thereasa Solo, MD 02/24/2019, 11:49 AM

## 2019-02-24 NOTE — Patient Instructions (Addendum)
Please notify Dr Denman George at phone number [phone] if you notice vaginal bleeding, new pelvic or abdominal pains, bloating, feeling full easy, or a change in bladder or bowel function.   Please contact Dr Serita Grit office (at [phone]) in September, 2020 to request an appointment with her for December, 2020.   Dr Denman George has prescribed diflucan - take 1.5 tablets once each time you have a symptomatic yeast infection.

## 2019-03-24 DIAGNOSIS — Z903 Acquired absence of stomach [part of]: Secondary | ICD-10-CM | POA: Diagnosis not present

## 2019-03-24 DIAGNOSIS — E119 Type 2 diabetes mellitus without complications: Secondary | ICD-10-CM | POA: Diagnosis not present

## 2019-03-24 DIAGNOSIS — E039 Hypothyroidism, unspecified: Secondary | ICD-10-CM | POA: Diagnosis not present

## 2019-05-26 DIAGNOSIS — E039 Hypothyroidism, unspecified: Secondary | ICD-10-CM | POA: Diagnosis not present

## 2019-05-26 DIAGNOSIS — Z903 Acquired absence of stomach [part of]: Secondary | ICD-10-CM | POA: Diagnosis not present

## 2019-05-26 DIAGNOSIS — E119 Type 2 diabetes mellitus without complications: Secondary | ICD-10-CM | POA: Diagnosis not present

## 2019-05-26 DIAGNOSIS — E559 Vitamin D deficiency, unspecified: Secondary | ICD-10-CM | POA: Diagnosis not present

## 2019-05-26 DIAGNOSIS — E1165 Type 2 diabetes mellitus with hyperglycemia: Secondary | ICD-10-CM | POA: Diagnosis not present

## 2019-06-02 DIAGNOSIS — H6121 Impacted cerumen, right ear: Secondary | ICD-10-CM | POA: Diagnosis not present

## 2019-06-02 DIAGNOSIS — Z7189 Other specified counseling: Secondary | ICD-10-CM | POA: Diagnosis not present

## 2019-06-02 DIAGNOSIS — R195 Other fecal abnormalities: Secondary | ICD-10-CM | POA: Diagnosis not present

## 2019-06-02 DIAGNOSIS — K219 Gastro-esophageal reflux disease without esophagitis: Secondary | ICD-10-CM | POA: Diagnosis not present

## 2019-06-02 DIAGNOSIS — E119 Type 2 diabetes mellitus without complications: Secondary | ICD-10-CM | POA: Diagnosis not present

## 2019-07-02 DIAGNOSIS — Z20828 Contact with and (suspected) exposure to other viral communicable diseases: Secondary | ICD-10-CM | POA: Diagnosis not present

## 2019-08-25 DIAGNOSIS — E039 Hypothyroidism, unspecified: Secondary | ICD-10-CM | POA: Diagnosis not present

## 2019-08-25 DIAGNOSIS — E1165 Type 2 diabetes mellitus with hyperglycemia: Secondary | ICD-10-CM | POA: Diagnosis not present

## 2019-08-25 DIAGNOSIS — Z903 Acquired absence of stomach [part of]: Secondary | ICD-10-CM | POA: Diagnosis not present

## 2019-08-25 DIAGNOSIS — E559 Vitamin D deficiency, unspecified: Secondary | ICD-10-CM | POA: Diagnosis not present

## 2019-09-22 DIAGNOSIS — E039 Hypothyroidism, unspecified: Secondary | ICD-10-CM | POA: Diagnosis not present

## 2019-09-22 DIAGNOSIS — E1165 Type 2 diabetes mellitus with hyperglycemia: Secondary | ICD-10-CM | POA: Diagnosis not present

## 2019-09-25 DIAGNOSIS — M545 Low back pain: Secondary | ICD-10-CM | POA: Diagnosis not present

## 2019-09-25 DIAGNOSIS — M25552 Pain in left hip: Secondary | ICD-10-CM | POA: Diagnosis not present

## 2019-09-25 DIAGNOSIS — M5136 Other intervertebral disc degeneration, lumbar region: Secondary | ICD-10-CM | POA: Diagnosis not present

## 2019-10-14 DIAGNOSIS — M545 Low back pain: Secondary | ICD-10-CM | POA: Diagnosis not present

## 2019-10-14 DIAGNOSIS — M5136 Other intervertebral disc degeneration, lumbar region: Secondary | ICD-10-CM | POA: Diagnosis not present

## 2019-10-23 DIAGNOSIS — M545 Low back pain: Secondary | ICD-10-CM | POA: Diagnosis not present

## 2019-11-05 DIAGNOSIS — M5416 Radiculopathy, lumbar region: Secondary | ICD-10-CM | POA: Diagnosis not present

## 2019-11-05 DIAGNOSIS — M545 Low back pain: Secondary | ICD-10-CM | POA: Diagnosis not present

## 2019-11-05 DIAGNOSIS — M5136 Other intervertebral disc degeneration, lumbar region: Secondary | ICD-10-CM | POA: Diagnosis not present

## 2019-11-24 DIAGNOSIS — Z903 Acquired absence of stomach [part of]: Secondary | ICD-10-CM | POA: Diagnosis not present

## 2019-11-24 DIAGNOSIS — E039 Hypothyroidism, unspecified: Secondary | ICD-10-CM | POA: Diagnosis not present

## 2019-11-24 DIAGNOSIS — E559 Vitamin D deficiency, unspecified: Secondary | ICD-10-CM | POA: Diagnosis not present

## 2019-11-24 DIAGNOSIS — E1165 Type 2 diabetes mellitus with hyperglycemia: Secondary | ICD-10-CM | POA: Diagnosis not present

## 2019-12-24 DIAGNOSIS — M5416 Radiculopathy, lumbar region: Secondary | ICD-10-CM | POA: Diagnosis not present

## 2019-12-24 DIAGNOSIS — M5136 Other intervertebral disc degeneration, lumbar region: Secondary | ICD-10-CM | POA: Diagnosis not present

## 2020-01-02 DIAGNOSIS — M5136 Other intervertebral disc degeneration, lumbar region: Secondary | ICD-10-CM | POA: Diagnosis not present

## 2020-01-02 DIAGNOSIS — M5442 Lumbago with sciatica, left side: Secondary | ICD-10-CM | POA: Diagnosis not present

## 2020-01-02 DIAGNOSIS — G8929 Other chronic pain: Secondary | ICD-10-CM | POA: Diagnosis not present

## 2020-02-17 ENCOUNTER — Telehealth: Payer: Self-pay | Admitting: *Deleted

## 2020-02-17 NOTE — Telephone Encounter (Signed)
Patient called and scheduled a follow up for June

## 2020-03-08 DIAGNOSIS — E559 Vitamin D deficiency, unspecified: Secondary | ICD-10-CM | POA: Diagnosis not present

## 2020-03-08 DIAGNOSIS — E039 Hypothyroidism, unspecified: Secondary | ICD-10-CM | POA: Diagnosis not present

## 2020-03-08 DIAGNOSIS — E1165 Type 2 diabetes mellitus with hyperglycemia: Secondary | ICD-10-CM | POA: Diagnosis not present

## 2020-03-08 DIAGNOSIS — Z903 Acquired absence of stomach [part of]: Secondary | ICD-10-CM | POA: Diagnosis not present

## 2020-03-10 ENCOUNTER — Inpatient Hospital Stay: Payer: BC Managed Care – PPO | Attending: Gynecologic Oncology | Admitting: Gynecologic Oncology

## 2020-03-10 ENCOUNTER — Encounter: Payer: Self-pay | Admitting: Gynecologic Oncology

## 2020-03-10 ENCOUNTER — Other Ambulatory Visit: Payer: Self-pay

## 2020-03-10 VITALS — BP 148/78 | HR 93 | Temp 99.1°F | Resp 18 | Ht 61.0 in | Wt 246.6 lb

## 2020-03-10 DIAGNOSIS — E65 Localized adiposity: Secondary | ICD-10-CM | POA: Diagnosis not present

## 2020-03-10 DIAGNOSIS — Z90722 Acquired absence of ovaries, bilateral: Secondary | ICD-10-CM | POA: Diagnosis not present

## 2020-03-10 DIAGNOSIS — E039 Hypothyroidism, unspecified: Secondary | ICD-10-CM | POA: Diagnosis not present

## 2020-03-10 DIAGNOSIS — Z79899 Other long term (current) drug therapy: Secondary | ICD-10-CM | POA: Diagnosis not present

## 2020-03-10 DIAGNOSIS — K219 Gastro-esophageal reflux disease without esophagitis: Secondary | ICD-10-CM | POA: Diagnosis not present

## 2020-03-10 DIAGNOSIS — Z9884 Bariatric surgery status: Secondary | ICD-10-CM | POA: Insufficient documentation

## 2020-03-10 DIAGNOSIS — Z7984 Long term (current) use of oral hypoglycemic drugs: Secondary | ICD-10-CM | POA: Diagnosis not present

## 2020-03-10 DIAGNOSIS — N898 Other specified noninflammatory disorders of vagina: Secondary | ICD-10-CM

## 2020-03-10 DIAGNOSIS — C541 Malignant neoplasm of endometrium: Secondary | ICD-10-CM | POA: Diagnosis not present

## 2020-03-10 DIAGNOSIS — F329 Major depressive disorder, single episode, unspecified: Secondary | ICD-10-CM | POA: Insufficient documentation

## 2020-03-10 DIAGNOSIS — M1990 Unspecified osteoarthritis, unspecified site: Secondary | ICD-10-CM | POA: Insufficient documentation

## 2020-03-10 DIAGNOSIS — E7800 Pure hypercholesterolemia, unspecified: Secondary | ICD-10-CM | POA: Diagnosis not present

## 2020-03-10 DIAGNOSIS — G4730 Sleep apnea, unspecified: Secondary | ICD-10-CM | POA: Insufficient documentation

## 2020-03-10 DIAGNOSIS — Z791 Long term (current) use of non-steroidal anti-inflammatories (NSAID): Secondary | ICD-10-CM | POA: Diagnosis not present

## 2020-03-10 DIAGNOSIS — Z90710 Acquired absence of both cervix and uterus: Secondary | ICD-10-CM

## 2020-03-10 DIAGNOSIS — E119 Type 2 diabetes mellitus without complications: Secondary | ICD-10-CM | POA: Diagnosis not present

## 2020-03-10 MED ORDER — METRONIDAZOLE 500 MG PO TABS: 500.0000 mg | ORAL_TABLET | Freq: Three times a day (TID) | ORAL | 0 refills | Status: DC

## 2020-03-10 NOTE — Patient Instructions (Signed)
Please notify Dr Denman George at phone number [phone] if you notice vaginal bleeding, new pelvic or abdominal pains, bloating, feeling full easy, or a change in bladder or bowel function.   Please follow-up in 6 months.  Dr Denman George has prescribed a 1 week course of flagyl for vaginal odor.  Dr Denman George has put in a request for plastic surgery consult for your symptomatic abdominal pannus.

## 2020-03-10 NOTE — Progress Notes (Signed)
Follow-up Note: Gyn-Onc  Consult was initially requested by Dr. Alden Hipp for the evaluation of April Scott 59 y.o. female  CC:  Chief Complaint  Patient presents with  . Endometrial cancer ([place])    follow up    Assessment/Plan:  April Scott is a 59 y.o.  with stage IA grade 1 endometrial cancer with ITC's in the left SLN. Normal MSI testing. S/p 6 months of megace.  I will see her back in 6 months.  Prescribed flagyl for vaginal odor.  Referred to plastic surgery for discussion and evaluation for panniculectomy. I explained that the plastic surgeon will determine if this appropiate.    HPI: April Scott is a 60 y.o.  gravida 2 para 0 reports intermittent vaginal spotting for approximately 6 months. The longest continuous flow lasted for approximately 3 weeks. Her last normal menstrual period was approximately one half years ago.  An endometrial biopsy collected on 06/2 27 2018 is notable for moderately differentiated endometrioid adenocarcinoma  (uterus sounded to 9 cm).  April Scott reports intermittent vaginal spotting since that procedure. Her history is notable for sending type 2 diabetes mellitus with a hemoglobin A1c of 7.2. The last time she checked a fasting blood sugar was 3 weeks ago in the value was 120.  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 Her weight loss stalled out at 72 pounds.   On 03/20/17 she underwent robotic assisted total hysterectomy, BSO, SLN biopsy. Surgery was uncomplicated. Postoperatively pathology revealed an 0.5cm tumor which was grade 1, with 0.2cm of 1.5cm myometrial invasion, no LVSI. ITC's were seen in the left pelvic SLN (common iliac). She was prescribed megace for maintenance adjuvant therapy. This caused significant toxicity of weight gain, fatigue and hair loss. She developed symptoms of carpal tunnel s/p surgery in march, 2019. Biopsy of  vaginal granulation tissue in March, 2019 was benign.    Interval Hx:  The patient denies vaginal bleeding or pelvic pains. She has symptoms of vaginal odor.  She reported that her abdominal pannus is very bothersome and enquired regarding if there is a surgery to treat this.  Review of Systems:  Constitutional  Feels well,  Cardiovascular  No chest pain, shortness of breath, or edema  Pulmonary  No cough or wheeze.  Gastro Intestinal  No nausea, vomitting, or diarrhoea. No bright red blood per rectum, no abdominal pain, change in bowel movement, or constipation.  Genito Urinary  No frequency, urgency, dysuria, Vaginal spotting  Musculo Skeletal  No myalgia, arthralgia, joint swelling or pain  Neurologic  No weakness, numbness, change in gait,  Psychology  No depression, anxiety, insomnia.    Current Meds:  Outpatient Encounter Medications as of 03/10/2020  Medication Sig  . atorvastatin (LIPITOR) 20 MG tablet   . Biotin w/ Vitamins C & E (HAIR/SKIN/NAILS PO) Take 1 tablet by mouth every evening.  Marland Kitchen ibuprofen (ADVIL,MOTRIN) 200 MG tablet Take 600-800 mg by mouth 2 (two) times daily as needed for moderate pain.   Marland Kitchen levothyroxine (SYNTHROID) 200 MCG tablet Take 1 tablet by mouth every evening  . Multiple Vitamins-Minerals (VITAMIN D3 COMPLETE PO) Take 1,000 Units by mouth daily.  Marland Kitchen omeprazole (PRILOSEC) 40 MG capsule Take 1 capsule (40 mg total) by mouth daily.  Marland Kitchen OZEMPIC, 0.25 OR 0.5 MG/DOSE, 2 MG/1.5ML SOPN   . VIIBRYD 20 MG TABS Take 1 tablet by mouth every evening.   . metroNIDAZOLE (FLAGYL) 500 MG tablet Take 1 tablet (  500 mg total) by mouth 3 (three) times daily.  . [DISCONTINUED] empagliflozin (JARDIANCE) 10 MG TABS tablet Take 10 mg by mouth every evening.  (Patient not taking: Reported on 03/10/2020)  . [DISCONTINUED] lisinopril (PRINIVIL,ZESTRIL) 2.5 MG tablet Take 2.5 mg by mouth every evening.  (Patient not taking: Reported on 03/10/2020)  . [DISCONTINUED] metFORMIN  (GLUCOPHAGE-XR) 500 MG 24 hr tablet Take 1,000 mg by mouth 2 (two) times daily. (Patient not taking: Reported on 03/10/2020)  . [DISCONTINUED] Omega-3 Fatty Acids (FISH OIL) 1000 MG CAPS Take 1 capsule by mouth daily. (Patient not taking: Reported on 03/10/2020)   Facility-Administered Encounter Medications as of 03/10/2020  Medication  . 0.9 %  sodium chloride infusion    Allergy: No Known Allergies  Social Hx:   Social History   Socioeconomic History  . Marital status: Married    Spouse name: Not on file  . Number of children: 6  . Years of education: 5  . Highest education level: Not on file  Occupational History  . Occupation: HR    Employer: [employer]  Tobacco Use  . Smoking status: Never Smoker  . Smokeless tobacco: Never Used  Vaping Use  . Vaping Use: Never used  Substance and Sexual Activity  . Alcohol use: No  . Drug use: No  . Sexual activity: Not on file  Other Topics Concern  . Not on file  Social History Narrative   Lives with husband in a one story home.  Has 6 children.  Works in [employer] for [employer].  Education: college.    Social Determinants of Health   Financial Resource Strain:   . Difficulty of Paying Living Expenses:   Food Insecurity:   . Worried About Charity fundraiser in the Last Year:   . Arboriculturist in the Last Year:   Transportation Needs:   . Film/video editor (Medical):   Marland Kitchen Lack of Transportation (Non-Medical):   Physical Activity:   . Days of Exercise per Week:   . Minutes of Exercise per Session:   Stress:   . Feeling of Stress :   Social Connections:   . Frequency of Communication with Friends and Family:   . Frequency of Social Gatherings with Friends and Family:   . Attends Religious Services:   . Active Member of Clubs or Organizations:   . Attends Archivist Meetings:   Marland Kitchen Marital Status:   Intimate Partner Violence:   . Fear of Current or Ex-Partner:   . Emotionally Abused:   Marland Kitchen Physically Abused:    . Sexually Abused:     Past Surgical Hx:  Past Surgical History:  Procedure Laterality Date  . colonscopy  2017 and 2012   with polyp removal  . LAPAROSCOPIC GASTRIC SLEEVE RESECTION  2012  . ROBOTIC ASSISTED TOTAL HYSTERECTOMY WITH BILATERAL SALPINGO OOPHERECTOMY Bilateral 03/20/2017   Procedure: XI ROBOTIC ASSISTED TOTAL HYSTERECTOMY WITH BILATERAL SALPINGO OOPHORECTOMY WITH SENTINAL LYMPH NODES;  Surgeon: Everitt Amber, MD;  Location: [facility];  Service: Gynecology;  Laterality: Bilateral;  . spleen repair  2012 after gastric sleeve    Past Medical Hx:  Past Medical History:  Diagnosis Date  . Abnormal uterine bleeding 3 weeks ago  . Anxiety   . Arthritis    hands, bulging discs lower back  . Colon polyps   . Depression   . Diabetes mellitus without complication ([place])    type 2  . Endometrial cancer ([place])   . GERD (  gastroesophageal reflux disease)   . Hypercholesteremia   . Hypothyroidism   . Renal disorder   . Sleep apnea   . Thyroid disease   Mammogram 2017 within normal limits colonoscopy in 2017 removal of 2 benign polyps colonoscopy 5 years prior to that also associated removal of polyps  Past Gynecological History:   Gravida 2 para 0 last normal menstrual period 1-1/2 years ago menarche at age 54 irregular menses throughout adulthood   Family Hx: Maternal aunt with ovarian cancer diagnosis 77 years of age father with leukemia in early adulthood and lung cancer in later adulthood  Vitals:  Blood pressure (!) 148/78, pulse 93, temperature 99.1 F (37.3 C), resp. rate 18, height '5\' 1"'$  (1.549 m), weight 246 lb 9.6 oz (111.9 kg), last menstrual period 02/19/2017, SpO2 98 %. Body mass index is 46.59 kg/m.   Physical Exam: WD in NAD Neck  Supple NROM, without any enlargements.  Lymph Node Survey No cervical supraclavicular or inguinal adenopathy Cardiovascular  Pulse normal rate, regularity and rhythm.  Lungs  Clear to auscultation bilaterally, without  wheezes/crackles/rhonchi. Good air movement.  Psychiatry  Alert and oriented appropriate mood affect speech and reasoning. Abdomen  Normoactive bowel sounds, abdomen soft, non-tender. Very obese. Well healed incisions. Large pannus.  Back No CVA tenderness Genito Urinary  Vaginal cuff smooth with no lesions. No masses deeper in pelvis.  No bleeding. No lesions Good tone, no masses no cul de sac nodularity.  Extremities  No bilateral cyanosis, clubbing or edema.   Thereasa Solo, MD 03/10/2020, 4:12 PM

## 2020-03-11 DIAGNOSIS — I10 Essential (primary) hypertension: Secondary | ICD-10-CM | POA: Diagnosis not present

## 2020-03-22 DIAGNOSIS — E039 Hypothyroidism, unspecified: Secondary | ICD-10-CM | POA: Diagnosis not present

## 2020-03-22 DIAGNOSIS — E1165 Type 2 diabetes mellitus with hyperglycemia: Secondary | ICD-10-CM | POA: Diagnosis not present

## 2020-03-22 DIAGNOSIS — Z903 Acquired absence of stomach [part of]: Secondary | ICD-10-CM | POA: Diagnosis not present

## 2020-05-18 ENCOUNTER — Ambulatory Visit: Payer: BC Managed Care – PPO | Admitting: Plastic Surgery

## 2020-05-18 ENCOUNTER — Other Ambulatory Visit: Payer: Self-pay

## 2020-05-18 ENCOUNTER — Encounter: Payer: Self-pay | Admitting: Plastic Surgery

## 2020-05-18 VITALS — BP 128/73 | HR 88 | Temp 98.5°F | Ht 61.0 in | Wt 243.0 lb

## 2020-05-18 DIAGNOSIS — E65 Localized adiposity: Secondary | ICD-10-CM | POA: Diagnosis not present

## 2020-05-18 NOTE — Progress Notes (Signed)
Patient ID: April Scott, female    DOB: 1961/04/15, 59 y.o.   MRN: [id number]   Chief Complaint  Patient presents with  . Advice Only  . Skin Problem    The patient is a 59 yrs old wf here for evaluation of her pannus.  The patient complains of continual back pain.  She went to physical therapy but that was 4 years ago.  It did not help her back pain.  The patient had a gastric sleeve 2012 by Dr. Morley Kos.  She does not think that it ever worked.  She did have some complications.  She describes what sounds like a spleen laceration.  She is 5 feet 1 inches tall and weighs 243 pounds.  She is not had any major change in her weight over the last 10 years.  She has hyperlipidemia, diabetes, hypothyroidism and obesity.  She also is treated for sleep apnea.  She is not a smoker.  Her last hemoglobin A1c was 9% in May 2019 according to her records and 3 years ago was 8.8.  Sometimes she gets rashes and irritation in her skin folds.  I do not feel any hernia.   Review of Systems  Constitutional: Negative.   HENT: Negative.   Eyes: Negative.   Respiratory: Negative.   Cardiovascular: Negative.  Negative for leg swelling.  Gastrointestinal: Negative.   Genitourinary: Negative.   Musculoskeletal: Positive for back pain.  Skin: Positive for rash.  Hematological: Negative.   Psychiatric/Behavioral: Negative.     Past Medical History:  Diagnosis Date  . Abnormal uterine bleeding 3 weeks ago  . Anxiety   . Arthritis    hands, bulging discs lower back  . Colon polyps   . Depression   . Diabetes mellitus without complication ([place])    type 2  . Endometrial cancer ([place])   . GERD (gastroesophageal reflux disease)   . Hypercholesteremia   . Hypothyroidism   . Renal disorder   . Sleep apnea   . Thyroid disease     Past Surgical History:  Procedure Laterality Date  . colonscopy  2017 and 2012   with polyp removal  . LAPAROSCOPIC GASTRIC SLEEVE RESECTION  2012  . ROBOTIC  ASSISTED TOTAL HYSTERECTOMY WITH BILATERAL SALPINGO OOPHERECTOMY Bilateral 03/20/2017   Procedure: XI ROBOTIC ASSISTED TOTAL HYSTERECTOMY WITH BILATERAL SALPINGO OOPHORECTOMY WITH SENTINAL LYMPH NODES;  Surgeon: Everitt Amber, MD;  Location: [facility];  Service: Gynecology;  Laterality: Bilateral;  . spleen repair  2012 after gastric sleeve      Current Outpatient Medications:  .  atorvastatin (LIPITOR) 20 MG tablet, , Disp: , Rfl:  .  Biotin w/ Vitamins C & E (HAIR/SKIN/NAILS PO), Take 1 tablet by mouth every evening., Disp: , Rfl:  .  ibuprofen (ADVIL,MOTRIN) 200 MG tablet, Take 600-800 mg by mouth 2 (two) times daily as needed for moderate pain. , Disp: , Rfl:  .  levothyroxine (SYNTHROID) 200 MCG tablet, Take 1 tablet by mouth every evening, Disp: , Rfl:  .  metroNIDAZOLE (FLAGYL) 500 MG tablet, Take 1 tablet (500 mg total) by mouth 3 (three) times daily. (Patient not taking: Reported on 05/18/2020), Disp: 21 tablet, Rfl: 0 .  Multiple Vitamins-Minerals (VITAMIN D3 COMPLETE PO), Take 1,000 Units by mouth daily., Disp: , Rfl:  .  omeprazole (PRILOSEC) 40 MG capsule, Take 1 capsule (40 mg total) by mouth daily., Disp: 90 capsule, Rfl: 1 .  OZEMPIC, 0.25 OR 0.5 MG/DOSE, 2 MG/1.5ML SOPN, , Disp: ,  Rfl:  .  VIIBRYD 20 MG TABS, Take 1 tablet by mouth every evening. , Disp: , Rfl:   Current Facility-Administered Medications:  .  0.9 %  sodium chloride infusion, 500 mL, Intravenous, Continuous, Ladene Artist, MD   Objective:   Vitals:   05/18/20 1435  BP: 128/73  Pulse: 88  Temp: 98.5 F (36.9 C)  SpO2: 96%    Physical Exam Vitals and nursing note reviewed.  Constitutional:      Appearance: Normal appearance.  HENT:     Head: Normocephalic and atraumatic.  Cardiovascular:     Rate and Rhythm: Normal rate.     Pulses: Normal pulses.  Pulmonary:     Effort: Pulmonary effort is normal.  Neurological:     General: No focal deficit present.     Mental Status: She is alert and oriented  to person, place, and time.  Psychiatric:        Mood and Affect: Mood normal.        Behavior: Behavior normal.        Thought Content: Thought content normal.     Assessment & Plan:  Abdominal pannus  Recommend PT and will make the referral.  Recommend [facility]. She will have to have a significant weight reduction in order to be eligible for surgery.  We discussed this and she is amenable to working towards weight reduction.  I have also referred her to Dr. Kaylyn Lim.  Hopefully he can evaluate her gastric sleeve and see if there is any adjustment that could be made that will help. We have requested her previous surgery reports.  Pictures were obtained of the patient and placed in the chart with the patient's or guardian's permission.  [place], DO

## 2020-05-26 DIAGNOSIS — E785 Hyperlipidemia, unspecified: Secondary | ICD-10-CM | POA: Diagnosis not present

## 2020-05-26 DIAGNOSIS — K219 Gastro-esophageal reflux disease without esophagitis: Secondary | ICD-10-CM | POA: Diagnosis not present

## 2020-06-02 DIAGNOSIS — Z0000 Encounter for general adult medical examination without abnormal findings: Secondary | ICD-10-CM | POA: Diagnosis not present

## 2020-06-02 DIAGNOSIS — G4730 Sleep apnea, unspecified: Secondary | ICD-10-CM | POA: Diagnosis not present

## 2020-06-02 DIAGNOSIS — E119 Type 2 diabetes mellitus without complications: Secondary | ICD-10-CM | POA: Diagnosis not present

## 2020-06-02 DIAGNOSIS — E039 Hypothyroidism, unspecified: Secondary | ICD-10-CM | POA: Diagnosis not present

## 2020-06-14 DIAGNOSIS — M65311 Trigger thumb, right thumb: Secondary | ICD-10-CM | POA: Diagnosis not present

## 2020-08-02 ENCOUNTER — Other Ambulatory Visit: Payer: Self-pay

## 2020-08-02 ENCOUNTER — Encounter: Payer: Self-pay | Admitting: Gynecologic Oncology

## 2020-08-02 ENCOUNTER — Inpatient Hospital Stay: Payer: BC Managed Care – PPO | Attending: Gynecologic Oncology | Admitting: Gynecologic Oncology

## 2020-08-02 ENCOUNTER — Inpatient Hospital Stay: Payer: BC Managed Care – PPO

## 2020-08-02 VITALS — BP 131/66 | HR 87 | Temp 96.7°F | Resp 18 | Wt 243.3 lb

## 2020-08-02 DIAGNOSIS — K219 Gastro-esophageal reflux disease without esophagitis: Secondary | ICD-10-CM | POA: Diagnosis not present

## 2020-08-02 DIAGNOSIS — E119 Type 2 diabetes mellitus without complications: Secondary | ICD-10-CM | POA: Insufficient documentation

## 2020-08-02 DIAGNOSIS — Z79899 Other long term (current) drug therapy: Secondary | ICD-10-CM | POA: Insufficient documentation

## 2020-08-02 DIAGNOSIS — C541 Malignant neoplasm of endometrium: Secondary | ICD-10-CM

## 2020-08-02 DIAGNOSIS — Z90710 Acquired absence of both cervix and uterus: Secondary | ICD-10-CM | POA: Diagnosis not present

## 2020-08-02 DIAGNOSIS — F32A Depression, unspecified: Secondary | ICD-10-CM | POA: Insufficient documentation

## 2020-08-02 DIAGNOSIS — Z90722 Acquired absence of ovaries, bilateral: Secondary | ICD-10-CM | POA: Insufficient documentation

## 2020-08-02 DIAGNOSIS — E039 Hypothyroidism, unspecified: Secondary | ICD-10-CM | POA: Diagnosis not present

## 2020-08-02 DIAGNOSIS — E7800 Pure hypercholesterolemia, unspecified: Secondary | ICD-10-CM | POA: Diagnosis not present

## 2020-08-02 DIAGNOSIS — M1990 Unspecified osteoarthritis, unspecified site: Secondary | ICD-10-CM | POA: Insufficient documentation

## 2020-08-02 LAB — BASIC METABOLIC PANEL
Anion gap: 10 (ref 5–15)
BUN: 15 mg/dL (ref 6–20)
CO2: 24 mmol/L (ref 22–32)
Calcium: 8.8 mg/dL — ABNORMAL LOW (ref 8.9–10.3)
Chloride: 106 mmol/L (ref 98–111)
Creatinine, Ser: 0.89 mg/dL (ref 0.44–1.00)
GFR, Estimated: >60 mL/min (ref >60)
Glucose, Bld: 163 mg/dL — ABNORMAL HIGH (ref 70–99)
Potassium: 4.2 mmol/L (ref 3.5–5.1)
Sodium: 140 mmol/L (ref 135–145)

## 2020-08-02 NOTE — Progress Notes (Signed)
Follow-up Note: Gyn-Onc  Consult was initially requested by Dr. Alden Hipp for the evaluation of April Scott 59 y.o. female  CC:  Chief Complaint  Patient presents with  . Endometrial cancer [facility])    Assessment/Plan:  April Scott is a 59 y.o.  with stage IA grade 1 endometrial cancer with ITC's in the left SLN. Normal MSI testing. S/p 6 months of megace.  I will see her back in 6 months.  Referred to plastic surgery for discussion and evaluation for panniculectomy - she is attempting to lose weight to make this a possibility.   Left sided back and radicular groin pain.  Will evaluate with CT abdomen and pelvis to ensure this is not secondary to recurrent endometrial cancer (for example in a nodal basin).   HPI: April Scott is a 59 y.o.  gravida 2 para 0 reports intermittent vaginal spotting for approximately 6 months. The longest continuous flow lasted for approximately 3 weeks. Her last normal menstrual period was approximately one half years ago.  An endometrial biopsy collected on 06/2 27 2018 is notable for moderately differentiated endometrioid adenocarcinoma  (uterus sounded to 9 cm).  [name] reports intermittent vaginal spotting since that procedure. Her history is notable for sending type 2 diabetes mellitus with a hemoglobin A1c of 7.2. The last time she checked a fasting blood sugar was 3 weeks ago in the value was 120.  On 03/20/17 she underwent robotic assisted total hysterectomy, BSO, SLN biopsy. Surgery was uncomplicated. Postoperatively pathology revealed an 0.5cm tumor which was grade 1, with 0.2cm of 1.5cm myometrial invasion, no LVSI. ITC's were seen in the left pelvic SLN (common iliac). She was prescribed megace for maintenance adjuvant therapy. This caused significant toxicity of weight gain, fatigue and hair loss. She developed symptoms of carpal tunnel s/p surgery in march, 2019. Biopsy of vaginal granulation tissue  in March, 2019 was benign.    Interval Hx:  The patient denies vaginal bleeding or pelvic pains. She saw her Dr. Marla Roe for consideration of panniculectomy and was informed this would be a possibility after her weight decreased to less than 200 pounds.  She has symptoms of left-sided back pain with radiculopathy through wrapping around her pelvis into her leg.  This concerns her for possible recurrence of her endometrial cancer.  She is seeing orthopedic surgery for this and is felt to not be a good candidate for back surgery at this time.  Review of Systems:  Constitutional  Feels well,  Cardiovascular  No chest pain, shortness of breath, or edema  Pulmonary  No cough or wheeze.  Gastro Intestinal  No nausea, vomitting, or diarrhoea. No bright red blood per rectum, no abdominal pain, change in bowel movement, or constipation.  Genito Urinary  No frequency, urgency, dysuria, Vaginal spotting  Musculo Skeletal  No myalgia, arthralgia, joint swelling or pain  Neurologic  No weakness, numbness, change in gait,  Psychology  No depression, anxiety, insomnia.    Current Meds:  Outpatient Encounter Medications as of 08/02/2020  Medication Sig  . atorvastatin (LIPITOR) 40 MG tablet Take 40 mg by mouth at bedtime.  . Biotin w/ Vitamins C & E (HAIR/SKIN/NAILS PO) Take 1 tablet by mouth every evening.  Marland Kitchen ibuprofen (ADVIL,MOTRIN) 200 MG tablet Take 600-800 mg by mouth 2 (two) times daily as needed for moderate pain.   Marland Kitchen levothyroxine (SYNTHROID) 150 MCG tablet Take 150 mcg by mouth.  . Multiple Vitamins-Minerals (VITAMIN D3 COMPLETE PO) Take 1,000 Units by mouth  daily.  . omeprazole (PRILOSEC) 40 MG capsule Take 1 capsule (40 mg total) by mouth daily.  Marland Kitchen OZEMPIC, 1 MG/DOSE, 4 MG/3ML SOPN Inject 1 mg into the skin once a week. Takes on Saturday  . VIIBRYD 20 MG TABS Take 1 tablet by mouth every evening.   . Vitamin D, Ergocalciferol, (DRISDOL) 1.25 MG (50000 UNIT) CAPS capsule Take  50,000 Units by mouth every 14 (fourteen) days.  . [DISCONTINUED] atorvastatin (LIPITOR) 20 MG tablet   . [DISCONTINUED] levothyroxine (SYNTHROID) 200 MCG tablet Take 1 tablet by mouth every evening  . [DISCONTINUED] metroNIDAZOLE (FLAGYL) 500 MG tablet Take 1 tablet (500 mg total) by mouth 3 (three) times daily. (Patient not taking: Reported on 05/18/2020)  . [DISCONTINUED] OZEMPIC, 0.25 OR 0.5 MG/DOSE, 2 MG/1.5ML SOPN    Facility-Administered Encounter Medications as of 08/02/2020  Medication  . 0.9 %  sodium chloride infusion    Allergy: No Known Allergies  Social Hx:   Social History   Socioeconomic History  . Marital status: Married    Spouse name: Not on file  . Number of children: 6  . Years of education: 16  . Highest education level: Not on file  Occupational History  . Occupation: HR    Employer: [employer]  Tobacco Use  . Smoking status: Never Smoker  . Smokeless tobacco: Never Used  Vaping Use  . Vaping Use: Never used  Substance and Sexual Activity  . Alcohol use: No  . Drug use: No  . Sexual activity: Not on file  Other Topics Concern  . Not on file  Social History Narrative   Lives with husband in a one story home.  Has 6 children.  Works in [employer] for [employer].  Education: college.    Social Determinants of Health   Financial Resource Strain:   . Difficulty of Paying Living Expenses: Not on file  Food Insecurity:   . Worried About Charity fundraiser in the Last Year: Not on file  . Ran Out of Food in the Last Year: Not on file  Transportation Needs:   . Lack of Transportation (Medical): Not on file  . Lack of Transportation (Non-Medical): Not on file  Physical Activity:   . Days of Exercise per Week: Not on file  . Minutes of Exercise per Session: Not on file  Stress:   . Feeling of Stress : Not on file  Social Connections:   . Frequency of Communication with Friends and Family: Not on file  . Frequency of Social Gatherings with Friends and  Family: Not on file  . Attends Religious Services: Not on file  . Active Member of Clubs or Organizations: Not on file  . Attends Archivist Meetings: Not on file  . Marital Status: Not on file  Intimate Partner Violence:   . Fear of Current or Ex-Partner: Not on file  . Emotionally Abused: Not on file  . Physically Abused: Not on file  . Sexually Abused: Not on file    Past Surgical Hx:  Past Surgical History:  Procedure Laterality Date  . colonscopy  2017 and 2012   with polyp removal  . LAPAROSCOPIC GASTRIC SLEEVE RESECTION  2012  . ROBOTIC ASSISTED TOTAL HYSTERECTOMY WITH BILATERAL SALPINGO OOPHERECTOMY Bilateral 03/20/2017   Procedure: XI ROBOTIC ASSISTED TOTAL HYSTERECTOMY WITH BILATERAL SALPINGO OOPHORECTOMY WITH SENTINAL LYMPH NODES;  Surgeon: Everitt Amber, MD;  Location: [facility];  Service: Gynecology;  Laterality: Bilateral;  . spleen repair  2012 after  gastric sleeve    Past Medical Hx:  Past Medical History:  Diagnosis Date  . Abnormal uterine bleeding 3 weeks ago  . Anxiety   . Arthritis    hands, bulging discs lower back  . Colon polyps   . Depression   . Diabetes mellitus without complication ([place])    type 2  . Endometrial cancer ([place])   . GERD (gastroesophageal reflux disease)   . Hypercholesteremia   . Hypothyroidism   . Renal disorder   . Sleep apnea   . Thyroid disease   Mammogram 2017 within normal limits colonoscopy in 2017 removal of 2 benign polyps colonoscopy 5 years prior to that also associated removal of polyps  Past Gynecological History:   Gravida 2 para 0 last normal menstrual period 1-1/2 years ago menarche at age 41 irregular menses throughout adulthood   Family Hx: Maternal aunt with ovarian cancer diagnosis 33 years of age father with leukemia in early adulthood and lung cancer in later adulthood  Vitals:  Blood pressure 131/66, pulse 87, temperature (!) 96.7 F (35.9 C), temperature source Tympanic, resp. rate 18, weight 243  lb 4.8 oz (110.4 kg), last menstrual period 02/19/2017, SpO2 98 %. Body mass index is 45.97 kg/m.   Physical Exam: WD in NAD Neck  Supple NROM, without any enlargements.  Lymph Node Survey No cervical supraclavicular or inguinal adenopathy Cardiovascular  Pulse normal rate, regularity and rhythm.  Lungs  Clear to auscultation bilaterally, without wheezes/crackles/rhonchi. Good air movement.  Psychiatry  Alert and oriented appropriate mood affect speech and reasoning. Abdomen  Normoactive bowel sounds, abdomen soft, non-tender. Very obese. Well healed incisions. Large pannus.  Back No CVA tenderness Genito Urinary  Vaginal cuff smooth with no lesions. No masses deeper in pelvis.  No bleeding. No lesions Good tone, no masses no cul de sac nodularity.  Extremities  No bilateral cyanosis, clubbing or edema.   Thereasa Solo, MD 08/02/2020, 3:01 PM

## 2020-08-02 NOTE — Patient Instructions (Signed)
Dr. Denman George has ordered a CT scan today to be performed to evaluate your back pain to ensure this is not caused from recurrence of your endometrial cancer.  Presuming this is normal please return to see Dr. Denman George as scheduled in May 2022.  Please notify Dr Denman George at phone number [phone] if you notice vaginal bleeding, new pelvic or abdominal pains, bloating, feeling full easy, or a change in bladder or bowel function.

## 2020-08-12 ENCOUNTER — Other Ambulatory Visit: Payer: Self-pay

## 2020-08-12 ENCOUNTER — Ambulatory Visit (HOSPITAL_COMMUNITY)
Admission: RE | Admit: 2020-08-12 | Discharge: 2020-08-12 | Disposition: A | Discharge status: Home / Self Care | Payer: BC Managed Care – PPO | Source: Ambulatory Visit | Attending: Gynecologic Oncology | Admitting: Gynecologic Oncology

## 2020-08-12 DIAGNOSIS — K449 Diaphragmatic hernia without obstruction or gangrene: Secondary | ICD-10-CM | POA: Diagnosis not present

## 2020-08-12 DIAGNOSIS — C541 Malignant neoplasm of endometrium: Secondary | ICD-10-CM | POA: Insufficient documentation

## 2020-08-12 DIAGNOSIS — Z90710 Acquired absence of both cervix and uterus: Secondary | ICD-10-CM | POA: Diagnosis not present

## 2020-08-12 DIAGNOSIS — R590 Localized enlarged lymph nodes: Secondary | ICD-10-CM | POA: Diagnosis not present

## 2020-08-12 MED ORDER — IOHEXOL 300 MG/ML  SOLN
100.0000 mL | Freq: Once | INTRAMUSCULAR | Status: AC | PRN
  Administered 2020-08-12: 100 mL via INTRAVENOUS

## 2020-08-13 ENCOUNTER — Telehealth: Payer: Self-pay

## 2020-08-13 NOTE — Telephone Encounter (Signed)
Told April Scott that the CT scan does not show any recurrence of endometrial cancer.  It shows some stable age advanced thoracic and lumbar degenerative changes per Melissa Cross,NP.

## 2020-08-31 DIAGNOSIS — R600 Localized edema: Secondary | ICD-10-CM | POA: Diagnosis not present

## 2020-08-31 DIAGNOSIS — M79605 Pain in left leg: Secondary | ICD-10-CM | POA: Diagnosis not present

## 2020-08-31 DIAGNOSIS — M1712 Unilateral primary osteoarthritis, left knee: Secondary | ICD-10-CM | POA: Diagnosis not present

## 2020-09-01 ENCOUNTER — Other Ambulatory Visit (HOSPITAL_COMMUNITY): Payer: Self-pay | Admitting: Family Medicine

## 2020-09-01 DIAGNOSIS — M7989 Other specified soft tissue disorders: Secondary | ICD-10-CM

## 2020-09-02 ENCOUNTER — Ambulatory Visit (HOSPITAL_COMMUNITY)
Admission: RE | Admit: 2020-09-02 | Discharge: 2020-09-02 | Disposition: A | Discharge status: Home / Self Care | Payer: BC Managed Care – PPO | Source: Ambulatory Visit | Attending: Family Medicine | Admitting: Family Medicine

## 2020-09-02 ENCOUNTER — Other Ambulatory Visit: Payer: Self-pay

## 2020-09-02 DIAGNOSIS — M7989 Other specified soft tissue disorders: Secondary | ICD-10-CM | POA: Diagnosis not present

## 2020-09-02 NOTE — Progress Notes (Signed)
Left lower extremity venous study completed.     Please see CV Proc for preliminary results.   Vonzell Schlatter, RVT

## 2020-09-15 DIAGNOSIS — M5416 Radiculopathy, lumbar region: Secondary | ICD-10-CM | POA: Diagnosis not present

## 2020-09-15 DIAGNOSIS — M5136 Other intervertebral disc degeneration, lumbar region: Secondary | ICD-10-CM | POA: Diagnosis not present

## 2020-09-16 DIAGNOSIS — M5416 Radiculopathy, lumbar region: Secondary | ICD-10-CM | POA: Diagnosis not present

## 2020-09-16 DIAGNOSIS — M5136 Other intervertebral disc degeneration, lumbar region: Secondary | ICD-10-CM | POA: Diagnosis not present

## 2020-10-21 DIAGNOSIS — M5416 Radiculopathy, lumbar region: Secondary | ICD-10-CM | POA: Diagnosis not present

## 2020-10-21 DIAGNOSIS — M5136 Other intervertebral disc degeneration, lumbar region: Secondary | ICD-10-CM | POA: Diagnosis not present

## 2020-10-28 DIAGNOSIS — M5416 Radiculopathy, lumbar region: Secondary | ICD-10-CM | POA: Diagnosis not present

## 2020-10-28 DIAGNOSIS — M5136 Other intervertebral disc degeneration, lumbar region: Secondary | ICD-10-CM | POA: Diagnosis not present

## 2020-11-16 ENCOUNTER — Ambulatory Visit: Payer: BC Managed Care – PPO | Admitting: Plastic Surgery

## 2021-01-11 ENCOUNTER — Telehealth: Payer: Self-pay | Admitting: *Deleted

## 2021-01-11 NOTE — Telephone Encounter (Signed)
Called and moved the patinet appt from 3:30 pm to 2:45 pm

## 2021-01-12 ENCOUNTER — Other Ambulatory Visit: Payer: Self-pay | Admitting: Physician Assistant

## 2021-01-12 DIAGNOSIS — K219 Gastro-esophageal reflux disease without esophagitis: Secondary | ICD-10-CM | POA: Diagnosis not present

## 2021-01-12 DIAGNOSIS — R1310 Dysphagia, unspecified: Secondary | ICD-10-CM | POA: Diagnosis not present

## 2021-01-12 DIAGNOSIS — R195 Other fecal abnormalities: Secondary | ICD-10-CM | POA: Diagnosis not present

## 2021-01-12 DIAGNOSIS — Z8371 Family history of colonic polyps: Secondary | ICD-10-CM | POA: Diagnosis not present

## 2021-01-27 ENCOUNTER — Encounter: Payer: Self-pay | Admitting: Gynecologic Oncology

## 2021-01-27 NOTE — Progress Notes (Signed)
Follow-up Note: Gyn-Onc  Consult was initially requested by Dr. Alden Hipp for the evaluation of April Scott 60 y.o. female  CC:  Chief Complaint  Patient presents with  . Endometrial cancer [facility])    Assessment/Plan:  Ms. April Scott is a 60 y.o.  with stage IA grade 1 endometrial cancer with ITC's in the left SLN. Normal MSI testing. S/p surgery in July, 2018 S/p 6 months of megace.  I will see her back in 6 months.  Left sided back and radicular groin pain.     HPI: Ms. April Scott is a 60 y.o.  gravida 2 para 0 reports intermittent vaginal spotting for approximately 6 months. The longest continuous flow lasted for approximately 3 weeks. Her last normal menstrual period was approximately one half years ago.  An endometrial biopsy collected on 06 27 2018 was notable for moderately differentiated endometrioid adenocarcinoma  (uterus sounded to 9 cm).    On 03/20/17 she underwent robotic assisted total hysterectomy, BSO, SLN biopsy. Surgery was uncomplicated. Postoperatively pathology revealed an 0.5cm tumor which was grade 1, with 0.2cm of 1.5cm myometrial invasion, no LVSI. ITC's were seen in the left pelvic SLN (common iliac). She was prescribed megace for maintenance adjuvant therapy. This caused significant toxicity of weight gain, fatigue and hair loss and was discontinued after less than 1 year.  She developed symptoms of carpal tunnel s/p surgery in March, 2019. Biopsy of vaginal granulation tissue in March, 2019 was benign.   Interval Hx:  She saw her Dr. Marla Roe from plastic surgery for consideration of panniculectomy and was informed this would be a possibility after her weight decreased to less than 200 pounds.  She had symptoms of left-sided back pain with radiculopathy through wrapping around her pelvis into her leg.  This concerned her for possible recurrence of her endometrial cancer.  She is seeing orthopedic surgery for this and is felt  to not be a good candidate for back surgery at this time. She underwent a CT scan on 08/12/20 to work up this symptom and it revealed no findings for recurrent tumor, locoregional adenopathy or distant metastatic disease. There were stable upper abdominal lymph nodes. No new or progressive findings.   The patient denies vaginal bleeding or pelvic pains. Her mother died from a stroke this year.   Review of Systems:  Constitutional  Feels well,  Cardiovascular  No chest pain, shortness of breath, or edema  Pulmonary  No cough or wheeze.  Gastro Intestinal  No nausea, vomitting, or diarrhoea. No bright red blood per rectum, no abdominal pain, change in bowel movement, or constipation.  Genito Urinary  No frequency, urgency, dysuria, no vaginal spotting  Musculo Skeletal  No myalgia, arthralgia, joint swelling or pain  Neurologic  No weakness, numbness, change in gait,  Psychology  No depression, anxiety, insomnia.    Current Meds:  Outpatient Encounter Medications as of 01/28/2021  Medication Sig  . atorvastatin (LIPITOR) 40 MG tablet Take 40 mg by mouth at bedtime.  . Biotin w/ Vitamins C & E (HAIR/SKIN/NAILS PO) Take 1 tablet by mouth every evening.  Marland Kitchen ibuprofen (ADVIL,MOTRIN) 200 MG tablet Take 600-800 mg by mouth 2 (two) times daily as needed for moderate pain.   Marland Kitchen levothyroxine (SYNTHROID) 150 MCG tablet Take 150 mcg by mouth.  Marland Kitchen omeprazole (PRILOSEC) 40 MG capsule Take 1 capsule (40 mg total) by mouth daily.  . Semaglutide-Weight Management (WEGOVY) 1.7 MG/0.75ML SOAJ Inject 1.7 mg into the skin once a week.  Marland Kitchen  VIIBRYD 20 MG TABS Take 1 tablet by mouth every evening.   . Vitamin D, Ergocalciferol, (DRISDOL) 1.25 MG (50000 UNIT) CAPS capsule Take 50,000 Units by mouth every 14 (fourteen) days.  . Multiple Vitamins-Minerals (VITAMIN D3 COMPLETE PO) Take 1,000 Units by mouth daily. (Patient not taking: Reported on 01/27/2021)  . OZEMPIC, 1 MG/DOSE, 4 MG/3ML SOPN Inject 1 mg into the  skin once a week. Takes on Saturday (Patient not taking: Reported on 01/27/2021)  . [DISCONTINUED] 0.9 %  sodium chloride infusion    No facility-administered encounter medications on file as of 01/28/2021.    Allergy: No Known Allergies  Social Hx:   Social History   Socioeconomic History  . Marital status: Married    Spouse name: Not on file  . Number of children: 6  . Years of education: 34  . Highest education level: Not on file  Occupational History  . Occupation: HR    Employer: [employer]  Tobacco Use  . Smoking status: Never Smoker  . Smokeless tobacco: Never Used  Vaping Use  . Vaping Use: Never used  Substance and Sexual Activity  . Alcohol use: No  . Drug use: No  . Sexual activity: Not on file  Other Topics Concern  . Not on file  Social History Narrative   Lives with husband in a one story home.  Has 6 children.  Works in [employer] for [employer].  Education: college.    Social Determinants of Health   Financial Resource Strain: Not on file  Food Insecurity: Not on file  Transportation Needs: Not on file  Physical Activity: Not on file  Stress: Not on file  Social Connections: Not on file  Intimate Partner Violence: Not on file    Past Surgical Hx:  Past Surgical History:  Procedure Laterality Date  . colonscopy  2017 and 2012   with polyp removal  . LAPAROSCOPIC GASTRIC SLEEVE RESECTION  2012  . ROBOTIC ASSISTED TOTAL HYSTERECTOMY WITH BILATERAL SALPINGO OOPHERECTOMY Bilateral 03/20/2017   Procedure: XI ROBOTIC ASSISTED TOTAL HYSTERECTOMY WITH BILATERAL SALPINGO OOPHORECTOMY WITH SENTINAL LYMPH NODES;  Surgeon: Everitt Amber, MD;  Location: [facility];  Service: Gynecology;  Laterality: Bilateral;  . spleen repair  2012 after gastric sleeve    Past Medical Hx:  Past Medical History:  Diagnosis Date  . Abnormal uterine bleeding 3 weeks ago  . Anxiety   . Arthritis    hands, bulging discs lower back  . Colon polyps   . Depression   . Diabetes mellitus  without complication ([place])    type 2  . Endometrial cancer ([place])   . GERD (gastroesophageal reflux disease)   . Hypercholesteremia   . Hypothyroidism   . Renal disorder   . Sleep apnea   . Thyroid disease   Mammogram 2017 within normal limits colonoscopy in 2017 removal of 2 benign polyps colonoscopy 5 years prior to that also associated removal of polyps  Past Gynecological History:   Gravida 2 para 0 last normal menstrual period 1-1/2 years ago menarche at age 23 irregular menses throughout adulthood   Family Hx: Maternal aunt with ovarian cancer diagnosis 65 years of age father with leukemia in early adulthood and lung cancer in later adulthood  Vitals:  Blood pressure 132/81, pulse (!) 108, temperature (!) 97.1 F (36.2 C), temperature source Tympanic, resp. rate 18, height $RemoveBe'5\' 1"'DTnogxFcP$  (1.549 m), weight 241 lb (109.3 kg), last menstrual period 02/19/2017, SpO2 99 %. Body mass index is 45.54 kg/m.  Physical Exam: WD in NAD Neck  Supple NROM, without any enlargements.  Lymph Node Survey No cervical supraclavicular or inguinal adenopathy Cardiovascular  Pulse normal rate, regularity and rhythm.  Lungs  Clear to auscultation bilaterally, without wheezes/crackles/rhonchi. Good air movement.  Psychiatry  Alert and oriented appropriate mood affect speech and reasoning. Abdomen  Normoactive bowel sounds, abdomen soft, non-tender. Very obese. Soft incisions. Large pannus.  Back No CVA tenderness Genito Urinary  Vaginal cuff smooth with no lesions. No masses deeper in pelvis.  No bleeding. No lesions Good tone, no masses no cul de sac nodularity.  Extremities  No bilateral cyanosis, clubbing or edema.   Thereasa Solo, MD 01/28/2021, 2:52 PM

## 2021-01-28 ENCOUNTER — Encounter: Payer: Self-pay | Admitting: Gynecologic Oncology

## 2021-01-28 ENCOUNTER — Other Ambulatory Visit: Payer: Self-pay

## 2021-01-28 ENCOUNTER — Inpatient Hospital Stay: Payer: BC Managed Care – PPO | Attending: Gynecologic Oncology | Admitting: Gynecologic Oncology

## 2021-01-28 VITALS — BP 132/81 | HR 108 | Temp 97.1°F | Resp 18 | Ht 61.0 in | Wt 241.0 lb

## 2021-01-28 DIAGNOSIS — E119 Type 2 diabetes mellitus without complications: Secondary | ICD-10-CM | POA: Diagnosis not present

## 2021-01-28 DIAGNOSIS — E039 Hypothyroidism, unspecified: Secondary | ICD-10-CM | POA: Insufficient documentation

## 2021-01-28 DIAGNOSIS — Z79899 Other long term (current) drug therapy: Secondary | ICD-10-CM | POA: Insufficient documentation

## 2021-01-28 DIAGNOSIS — R1030 Lower abdominal pain, unspecified: Secondary | ICD-10-CM | POA: Diagnosis not present

## 2021-01-28 DIAGNOSIS — E7800 Pure hypercholesterolemia, unspecified: Secondary | ICD-10-CM | POA: Diagnosis not present

## 2021-01-28 DIAGNOSIS — Z90710 Acquired absence of both cervix and uterus: Secondary | ICD-10-CM | POA: Diagnosis not present

## 2021-01-28 DIAGNOSIS — R1032 Left lower quadrant pain: Secondary | ICD-10-CM | POA: Insufficient documentation

## 2021-01-28 DIAGNOSIS — K219 Gastro-esophageal reflux disease without esophagitis: Secondary | ICD-10-CM | POA: Diagnosis not present

## 2021-01-28 DIAGNOSIS — C541 Malignant neoplasm of endometrium: Secondary | ICD-10-CM | POA: Diagnosis not present

## 2021-01-28 DIAGNOSIS — Z90722 Acquired absence of ovaries, bilateral: Secondary | ICD-10-CM | POA: Diagnosis not present

## 2021-01-28 NOTE — Patient Instructions (Signed)
Please notify Dr Denman George at phone number [phone] if you notice vaginal bleeding, new pelvic or abdominal pains, bloating, feeling full easy, or a change in bladder or bowel function.   Please follow-up with Dr Denman George in November as scheduled.

## 2021-02-15 DIAGNOSIS — R509 Fever, unspecified: Secondary | ICD-10-CM | POA: Diagnosis not present

## 2021-02-15 DIAGNOSIS — J029 Acute pharyngitis, unspecified: Secondary | ICD-10-CM | POA: Diagnosis not present

## 2021-02-15 DIAGNOSIS — R0682 Tachypnea, not elsewhere classified: Secondary | ICD-10-CM | POA: Diagnosis not present

## 2021-02-15 DIAGNOSIS — E119 Type 2 diabetes mellitus without complications: Secondary | ICD-10-CM | POA: Diagnosis not present

## 2021-02-15 DIAGNOSIS — R0602 Shortness of breath: Secondary | ICD-10-CM | POA: Diagnosis not present

## 2021-02-15 DIAGNOSIS — R059 Cough, unspecified: Secondary | ICD-10-CM | POA: Diagnosis not present

## 2021-02-15 DIAGNOSIS — E039 Hypothyroidism, unspecified: Secondary | ICD-10-CM | POA: Diagnosis not present

## 2021-02-15 DIAGNOSIS — R0789 Other chest pain: Secondary | ICD-10-CM | POA: Diagnosis not present

## 2021-02-15 DIAGNOSIS — R079 Chest pain, unspecified: Secondary | ICD-10-CM | POA: Diagnosis not present

## 2021-02-15 DIAGNOSIS — U071 COVID-19: Secondary | ICD-10-CM | POA: Diagnosis not present

## 2021-02-15 DIAGNOSIS — I10 Essential (primary) hypertension: Secondary | ICD-10-CM | POA: Diagnosis not present

## 2021-02-15 DIAGNOSIS — E785 Hyperlipidemia, unspecified: Secondary | ICD-10-CM | POA: Diagnosis not present

## 2021-02-15 DIAGNOSIS — J9811 Atelectasis: Secondary | ICD-10-CM | POA: Diagnosis not present

## 2021-02-21 DIAGNOSIS — E559 Vitamin D deficiency, unspecified: Secondary | ICD-10-CM | POA: Diagnosis not present

## 2021-02-21 DIAGNOSIS — E1165 Type 2 diabetes mellitus with hyperglycemia: Secondary | ICD-10-CM | POA: Diagnosis not present

## 2021-02-21 DIAGNOSIS — E039 Hypothyroidism, unspecified: Secondary | ICD-10-CM | POA: Diagnosis not present

## 2021-02-22 ENCOUNTER — Ambulatory Visit
Admission: RE | Admit: 2021-02-22 | Discharge: 2021-02-22 | Disposition: A | Discharge status: Home / Self Care | Payer: BC Managed Care – PPO | Source: Ambulatory Visit | Attending: Physician Assistant | Admitting: Physician Assistant

## 2021-02-22 ENCOUNTER — Other Ambulatory Visit: Payer: Self-pay | Admitting: Physician Assistant

## 2021-02-22 DIAGNOSIS — R1310 Dysphagia, unspecified: Secondary | ICD-10-CM

## 2021-03-28 DIAGNOSIS — M65331 Trigger finger, right middle finger: Secondary | ICD-10-CM | POA: Diagnosis not present

## 2021-05-03 DIAGNOSIS — K2930 Chronic superficial gastritis without bleeding: Secondary | ICD-10-CM | POA: Diagnosis not present

## 2021-05-03 DIAGNOSIS — K224 Dyskinesia of esophagus: Secondary | ICD-10-CM | POA: Diagnosis not present

## 2021-05-03 DIAGNOSIS — K529 Noninfective gastroenteritis and colitis, unspecified: Secondary | ICD-10-CM | POA: Diagnosis not present

## 2021-05-03 DIAGNOSIS — Z8371 Family history of colonic polyps: Secondary | ICD-10-CM | POA: Diagnosis not present

## 2021-05-03 DIAGNOSIS — K5730 Diverticulosis of large intestine without perforation or abscess without bleeding: Secondary | ICD-10-CM | POA: Diagnosis not present

## 2021-05-03 DIAGNOSIS — K648 Other hemorrhoids: Secondary | ICD-10-CM | POA: Diagnosis not present

## 2021-05-03 DIAGNOSIS — K449 Diaphragmatic hernia without obstruction or gangrene: Secondary | ICD-10-CM | POA: Diagnosis not present

## 2021-05-03 DIAGNOSIS — R1310 Dysphagia, unspecified: Secondary | ICD-10-CM | POA: Diagnosis not present

## 2021-05-03 DIAGNOSIS — K2289 Other specified disease of esophagus: Secondary | ICD-10-CM | POA: Diagnosis not present

## 2021-05-03 DIAGNOSIS — K621 Rectal polyp: Secondary | ICD-10-CM | POA: Diagnosis not present

## 2021-05-03 DIAGNOSIS — Z86010 Personal history of colonic polyps: Secondary | ICD-10-CM | POA: Diagnosis not present

## 2021-05-03 DIAGNOSIS — D128 Benign neoplasm of rectum: Secondary | ICD-10-CM | POA: Diagnosis not present

## 2021-05-03 DIAGNOSIS — Z9884 Bariatric surgery status: Secondary | ICD-10-CM | POA: Diagnosis not present

## 2021-05-09 DIAGNOSIS — K449 Diaphragmatic hernia without obstruction or gangrene: Secondary | ICD-10-CM | POA: Diagnosis not present

## 2021-05-09 DIAGNOSIS — Z9889 Other specified postprocedural states: Secondary | ICD-10-CM | POA: Diagnosis not present

## 2021-05-09 DIAGNOSIS — K224 Dyskinesia of esophagus: Secondary | ICD-10-CM | POA: Diagnosis not present

## 2021-05-09 DIAGNOSIS — K219 Gastro-esophageal reflux disease without esophagitis: Secondary | ICD-10-CM | POA: Diagnosis not present

## 2021-05-25 DIAGNOSIS — E039 Hypothyroidism, unspecified: Secondary | ICD-10-CM | POA: Diagnosis not present

## 2021-05-25 DIAGNOSIS — E1165 Type 2 diabetes mellitus with hyperglycemia: Secondary | ICD-10-CM | POA: Diagnosis not present

## 2021-05-25 DIAGNOSIS — E559 Vitamin D deficiency, unspecified: Secondary | ICD-10-CM | POA: Diagnosis not present

## 2021-05-30 DIAGNOSIS — E785 Hyperlipidemia, unspecified: Secondary | ICD-10-CM | POA: Diagnosis not present

## 2021-05-30 DIAGNOSIS — Z0000 Encounter for general adult medical examination without abnormal findings: Secondary | ICD-10-CM | POA: Diagnosis not present

## 2021-05-30 DIAGNOSIS — E559 Vitamin D deficiency, unspecified: Secondary | ICD-10-CM | POA: Diagnosis not present

## 2021-05-30 DIAGNOSIS — I10 Essential (primary) hypertension: Secondary | ICD-10-CM | POA: Diagnosis not present

## 2021-05-30 DIAGNOSIS — E039 Hypothyroidism, unspecified: Secondary | ICD-10-CM | POA: Diagnosis not present

## 2021-05-30 DIAGNOSIS — E119 Type 2 diabetes mellitus without complications: Secondary | ICD-10-CM | POA: Diagnosis not present

## 2021-06-06 DIAGNOSIS — Z8542 Personal history of malignant neoplasm of other parts of uterus: Secondary | ICD-10-CM | POA: Diagnosis not present

## 2021-06-06 DIAGNOSIS — Z23 Encounter for immunization: Secondary | ICD-10-CM | POA: Diagnosis not present

## 2021-06-06 DIAGNOSIS — E559 Vitamin D deficiency, unspecified: Secondary | ICD-10-CM | POA: Diagnosis not present

## 2021-06-06 DIAGNOSIS — Z0000 Encounter for general adult medical examination without abnormal findings: Secondary | ICD-10-CM | POA: Diagnosis not present

## 2021-06-06 DIAGNOSIS — M5136 Other intervertebral disc degeneration, lumbar region: Secondary | ICD-10-CM | POA: Diagnosis not present

## 2021-06-22 ENCOUNTER — Telehealth: Payer: Self-pay | Admitting: *Deleted

## 2021-06-22 NOTE — Telephone Encounter (Signed)
Spoke with the patient and rescheduled the patient from 11/1 to 11/2 with Dr Delsa Sale

## 2021-07-12 ENCOUNTER — Encounter: Payer: Self-pay | Admitting: Obstetrics & Gynecology

## 2021-07-12 ENCOUNTER — Ambulatory Visit: Payer: BC Managed Care – PPO | Admitting: Gynecologic Oncology

## 2021-07-13 ENCOUNTER — Other Ambulatory Visit: Payer: Self-pay

## 2021-07-13 ENCOUNTER — Inpatient Hospital Stay: Payer: BC Managed Care – PPO | Attending: Obstetrics & Gynecology | Admitting: Obstetrics & Gynecology

## 2021-07-13 VITALS — BP 147/80 | HR 95 | Temp 98.5°F | Resp 16 | Wt 236.0 lb

## 2021-07-13 DIAGNOSIS — C541 Malignant neoplasm of endometrium: Secondary | ICD-10-CM | POA: Insufficient documentation

## 2021-07-13 DIAGNOSIS — Z79818 Long term (current) use of other agents affecting estrogen receptors and estrogen levels: Secondary | ICD-10-CM | POA: Diagnosis not present

## 2021-07-13 NOTE — Patient Instructions (Signed)
Plan to follow up in six months or sooner if needed. Please call the office at [phone] in Feb or March 2023 to schedule an appointment for April 2023.  Symptoms to report to your health care team include vaginal bleeding, rectal bleeding, bloating, weight loss without effort, new and persistent pain, new and  persistent fatigue, new leg swelling, new masses (i.e., bumps in your neck or groin), new and persistent cough, new and persistent nausea and vomiting, change in bowel or bladder habits, and any other concerns.

## 2021-07-13 NOTE — Progress Notes (Signed)
Follow Up Note: Gyn-Onc  April Scott 60 y.o. female  CC: F/U visit   HPI: The oncology history was reviewed.  Interval History: She denies any vaginal bleeding, abdominal/pelvic pain, cough, lethargy or increasing abdominal girth.   Review of Systems  Review of Systems  Constitutional:  Negative for malaise/fatigue and weight loss.  Respiratory:  Negative for shortness of breath and wheezing.   Cardiovascular:  Negative for chest pain and leg swelling.  Gastrointestinal:  Negative for abdominal pain, blood in stool, constipation, nausea and vomiting.  Genitourinary:  Negative for dysuria, frequency, hematuria and urgency.  Musculoskeletal:  Negative for joint pain and myalgias.  Neurological:  Negative for weakness.  Psychiatric/Behavioral:  Negative for depression. The patient does not have insomnia.    Current medications, allergy, social history, past surgical history, past medical history, family history were all reviewed.   Vitals:  BP (!) 147/80 (BP Location: Right Arm, Patient Position: Sitting)   Pulse 95   Temp 98.5 F (36.9 C)   Resp 16   Wt 236 lb (107 kg)   LMP 02/19/2017 (Approximate)   SpO2 100%   BMI 44.59 kg/m    Physical Exam:  Physical Exam Exam conducted with a chaperone present.  Constitutional:      General: She is not in acute distress. Cardiovascular:     Rate and Rhythm: Normal rate and regular rhythm.  Pulmonary:     Effort: Pulmonary effort is normal.     Breath sounds: Normal breath sounds. No wheezing or rhonchi.  Abdominal:     Palpations: Abdomen is soft.     Tenderness: There is no abdominal tenderness. There is no right CVA tenderness or left CVA tenderness.     Hernia: No hernia is present.  Genitourinary:    General: Normal vulva.     Urethra: No urethral lesion.     Vagina: No lesions. No bleeding.  Musculoskeletal:     Cervical back: Neck supple.     Right lower leg: No edema.     Left lower leg: No edema.   Lymphadenopathy:     Upper Body:     Right upper body: No supraclavicular adenopathy.     Left upper body: No supraclavicular adenopathy.     Lower Body: No right inguinal adenopathy. No left inguinal adenopathy.  Skin:    Findings: No rash.  Neurological:     Mental Status: She is oriented to person, place, and time.   Assessment/Plan: Endometrial cancer ([place]) Ms. April Scott is a 60 y.o.  with stage IA grade 1 endometrial cancer with ITC's in the left SLN. Normal MSI testing. S/p surgery in July, 2018 S/p 6 months of megace. Negative symptom review, normal exam.  No evidence of recurrence  Return in 6 months    Lahoma Crocker, MD

## 2021-07-13 NOTE — Assessment & Plan Note (Signed)
April Scott is a 60 y.o.  with stage IA grade 1 endometrial cancer with ITC's in the left SLN. Normal MSI testing. S/p surgery in July, 2018 S/p 6 months of megace. Negative symptom review, normal exam.  No evidence of recurrence  Return in 6 months

## 2021-07-14 ENCOUNTER — Encounter: Payer: Self-pay | Admitting: Obstetrics & Gynecology

## 2021-07-14 ENCOUNTER — Telehealth: Payer: Self-pay | Admitting: *Deleted

## 2021-07-14 DIAGNOSIS — F4310 Post-traumatic stress disorder, unspecified: Secondary | ICD-10-CM | POA: Diagnosis not present

## 2021-07-14 NOTE — Telephone Encounter (Signed)
error 

## 2021-07-18 DIAGNOSIS — F4310 Post-traumatic stress disorder, unspecified: Secondary | ICD-10-CM | POA: Diagnosis not present

## 2021-07-20 DIAGNOSIS — F4310 Post-traumatic stress disorder, unspecified: Secondary | ICD-10-CM | POA: Diagnosis not present

## 2021-07-25 DIAGNOSIS — F4310 Post-traumatic stress disorder, unspecified: Secondary | ICD-10-CM | POA: Diagnosis not present

## 2021-07-27 DIAGNOSIS — F4310 Post-traumatic stress disorder, unspecified: Secondary | ICD-10-CM | POA: Diagnosis not present

## 2021-08-01 DIAGNOSIS — F4310 Post-traumatic stress disorder, unspecified: Secondary | ICD-10-CM | POA: Diagnosis not present

## 2021-08-02 DIAGNOSIS — G8929 Other chronic pain: Secondary | ICD-10-CM | POA: Diagnosis not present

## 2021-08-02 DIAGNOSIS — M5416 Radiculopathy, lumbar region: Secondary | ICD-10-CM | POA: Diagnosis not present

## 2021-08-03 DIAGNOSIS — F4310 Post-traumatic stress disorder, unspecified: Secondary | ICD-10-CM | POA: Diagnosis not present

## 2021-08-08 DIAGNOSIS — F4310 Post-traumatic stress disorder, unspecified: Secondary | ICD-10-CM | POA: Diagnosis not present

## 2021-08-10 DIAGNOSIS — F4310 Post-traumatic stress disorder, unspecified: Secondary | ICD-10-CM | POA: Diagnosis not present

## 2021-08-13 IMAGING — RF DG ESOPHAGUS
8 series · 14 of 24 positions shown · non-contrast
Comparison: None.

CLINICAL DATA: Esophageal dysphagia.

EXAM:
ESOPHOGRAM / BARIUM SWALLOW / BARIUM TABLET STUDY
TECHNIQUE: Combined double contrast and single contrast examination performed
using effervescent crystals, thick barium liquid, and thin barium
liquid. The patient was observed with fluoroscopy swallowing a 13 mm
barium sulphate tablet.
FLUOROSCOPY TIME:  Fluoroscopy Time:  3 minutes and 6 seconds
Radiation Exposure Index (if provided by the fluoroscopic device):
45 mGy
Number of Acquired Spot Images: 0

[Series 1: sequence · 2 of 25 frames shown (1 of 7)]
[frame 4/25]
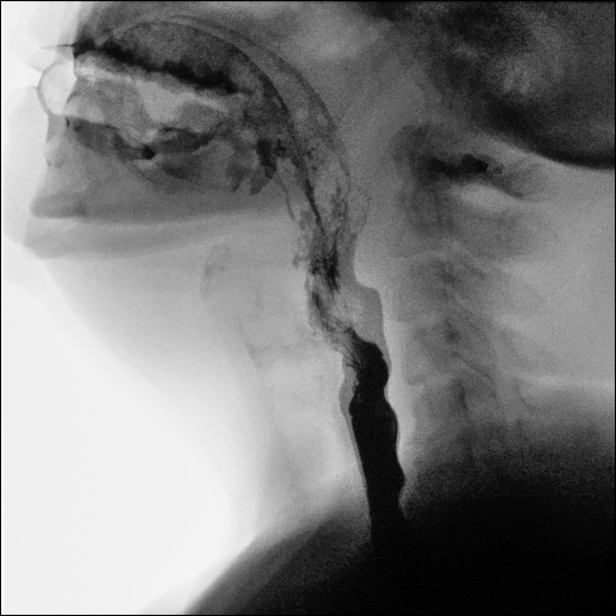
[frame 22/25]
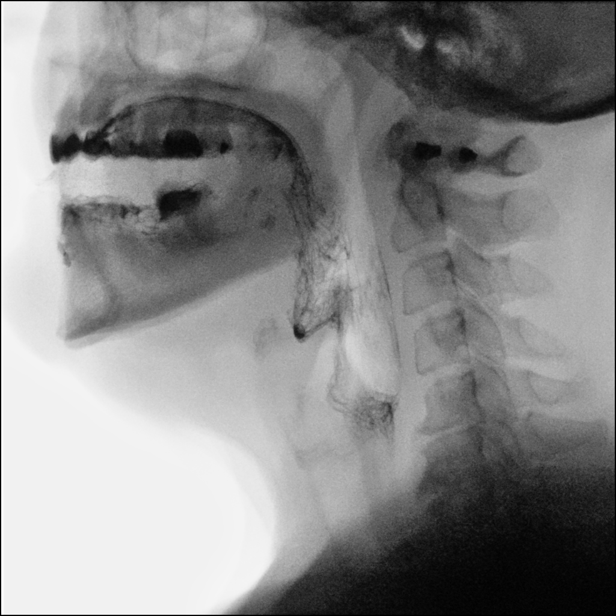

[Series 2: sequence · 1 of 11 frames shown (2 of 7)]
[frame 6/11]
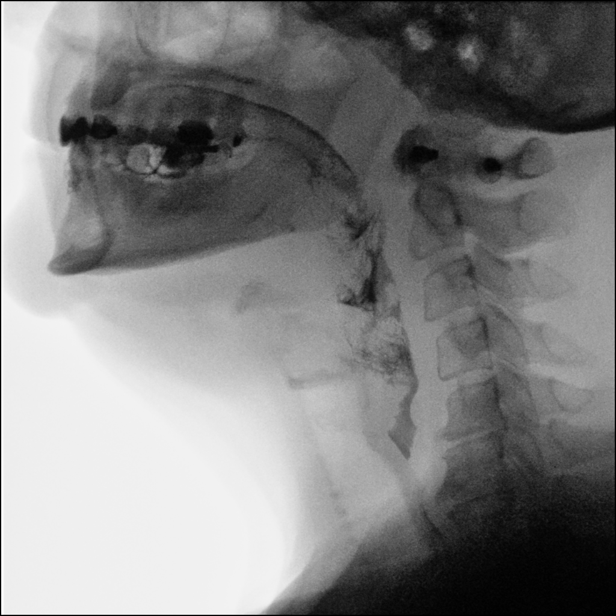

[Series 3: sequence · 2 of 16 frames shown (3 of 7)]
[frame 3/16]
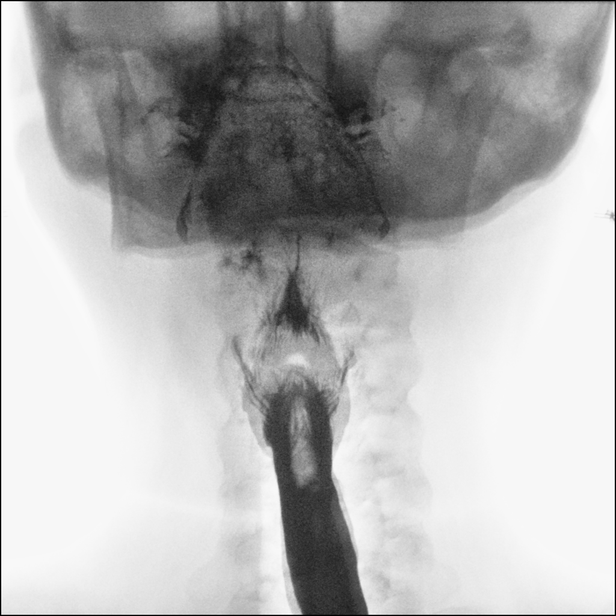
[frame 9/16]
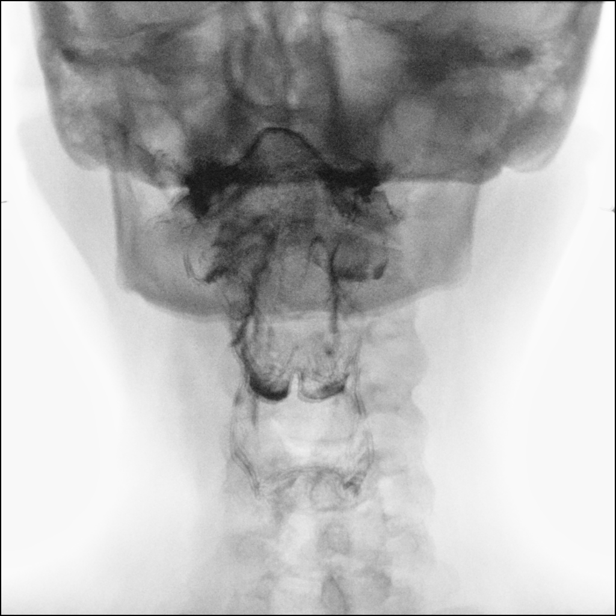

[Series 4: one shot · 0.15mm/px · 3 of 5 slices shown]
[im 1/5]
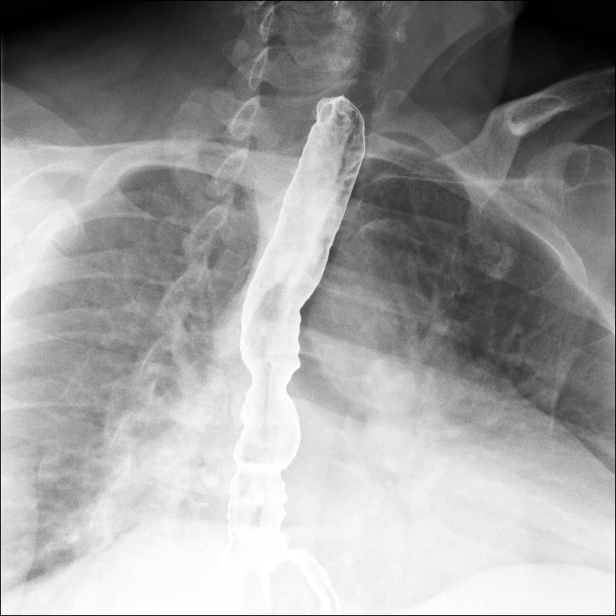
[im 4/5]
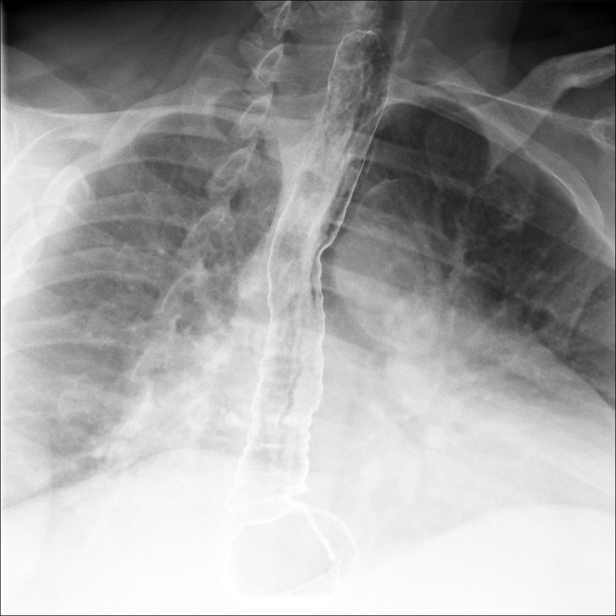
[im 5/5]
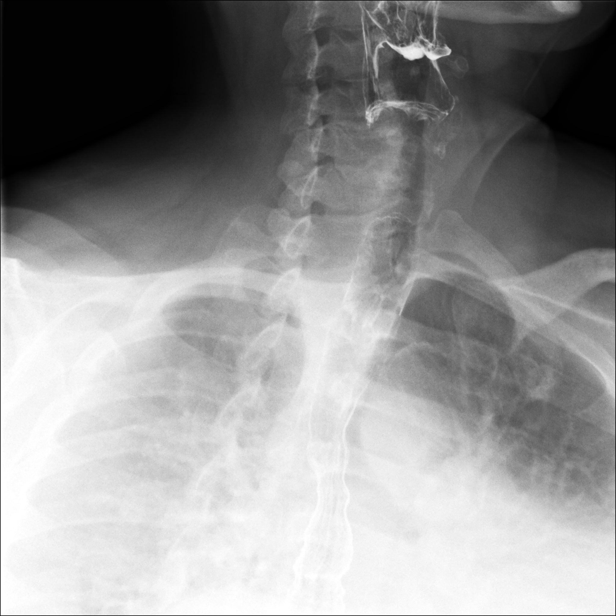

[Series 5: sequence · 1 of 63 frames shown (4 of 7)]
[frame 32/63]
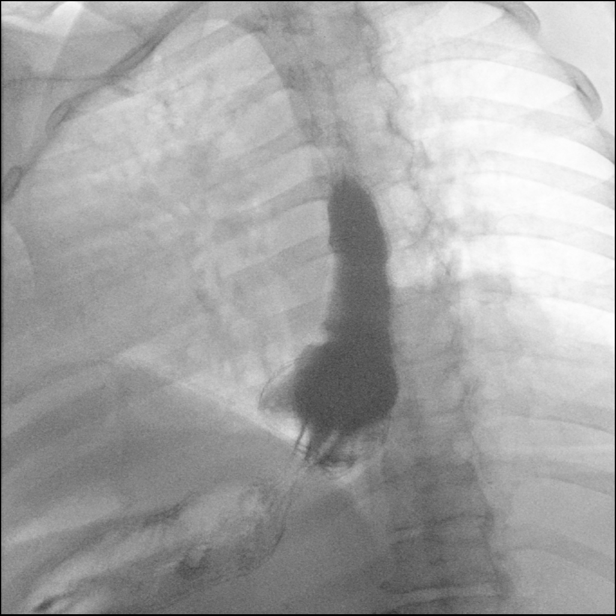

[Series 6: sequence · 2 of 140 frames shown (5 of 7)]
[frame 13/140]
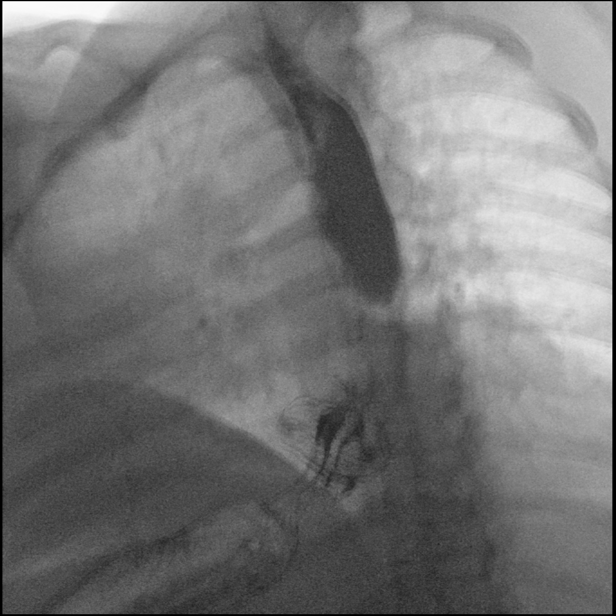
[frame 120/140]
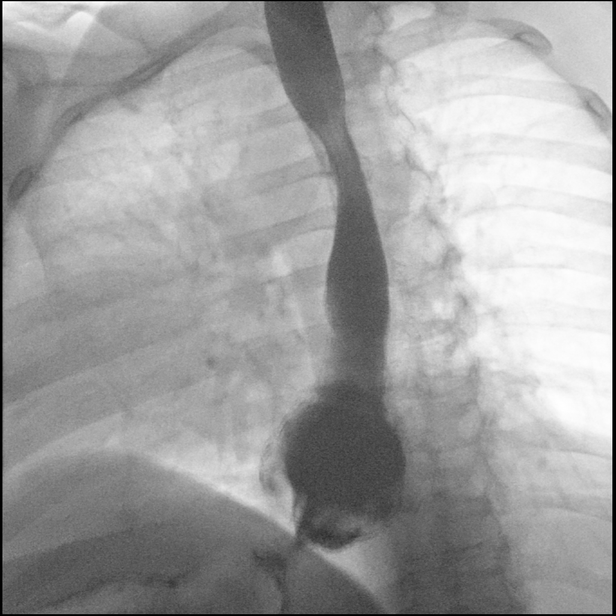

[Series 7: sequence · 2 of 4 frames shown (6 of 7)]
[frame 1/4]
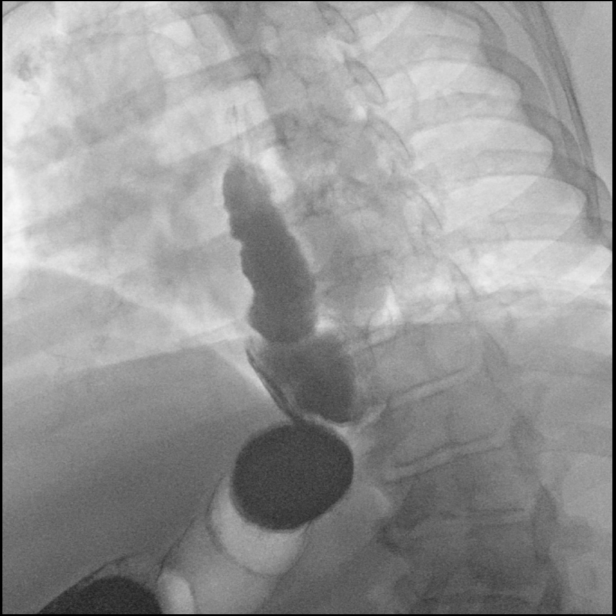
[frame 4/4]
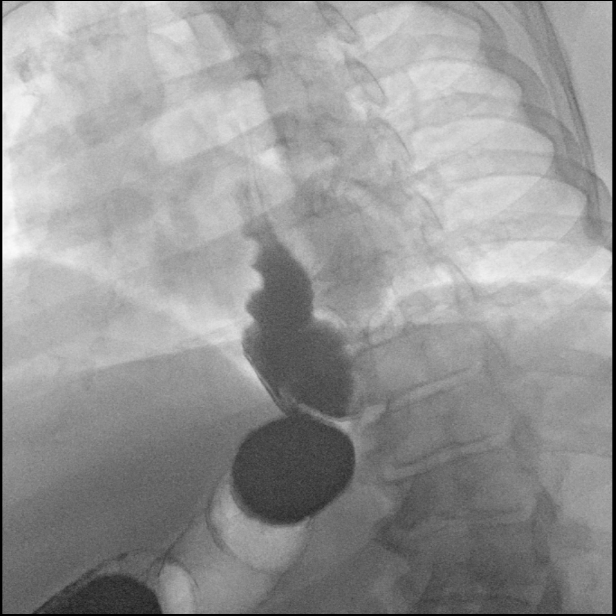

[Series 8: sequence · 1 of 23 frames shown (7 of 7)]
[frame 20/23]
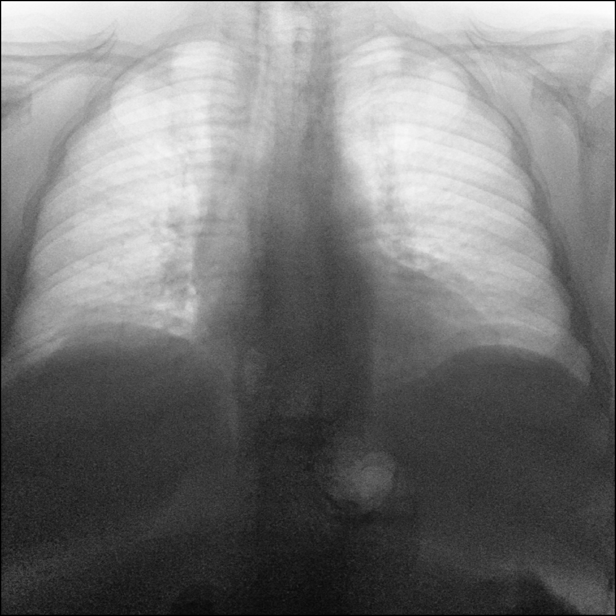

[14 of 24 positions shown; findings below may reference images not displayed]

FINDINGS: Initial barium swallows demonstrate normal pharyngeal motion with
swallowing. No laryngeal penetration or aspiration. No upper
esophageal webs, strictures or diverticuli.

Nonspecific esophageal motility disorder with occasional tertiary
contractions.

Moderate-sized hiatal hernia with spontaneous GE reflux. No
esophageal mass or stricture. The 13 mm barium pill passed into the
stomach without difficulty.
IMPRESSION: 1. Nonspecific esophageal motility disorder.
2. Moderate-sized hiatal hernia with GE reflux.
3. No esophageal mass or stricture.

## 2021-08-15 ENCOUNTER — Telehealth: Payer: Self-pay | Admitting: *Deleted

## 2021-08-15 DIAGNOSIS — F4310 Post-traumatic stress disorder, unspecified: Secondary | ICD-10-CM | POA: Diagnosis not present

## 2021-08-15 NOTE — Telephone Encounter (Signed)
Per records request from [facility], fax records

## 2021-08-16 DIAGNOSIS — K529 Noninfective gastroenteritis and colitis, unspecified: Secondary | ICD-10-CM | POA: Diagnosis not present

## 2021-08-16 DIAGNOSIS — K449 Diaphragmatic hernia without obstruction or gangrene: Secondary | ICD-10-CM | POA: Diagnosis not present

## 2021-08-16 DIAGNOSIS — K219 Gastro-esophageal reflux disease without esophagitis: Secondary | ICD-10-CM | POA: Diagnosis not present

## 2021-08-16 DIAGNOSIS — K224 Dyskinesia of esophagus: Secondary | ICD-10-CM | POA: Diagnosis not present

## 2021-08-17 DIAGNOSIS — F4310 Post-traumatic stress disorder, unspecified: Secondary | ICD-10-CM | POA: Diagnosis not present

## 2021-08-22 DIAGNOSIS — F4310 Post-traumatic stress disorder, unspecified: Secondary | ICD-10-CM | POA: Diagnosis not present

## 2021-08-24 DIAGNOSIS — F4310 Post-traumatic stress disorder, unspecified: Secondary | ICD-10-CM | POA: Diagnosis not present

## 2021-08-25 DIAGNOSIS — Z79891 Long term (current) use of opiate analgesic: Secondary | ICD-10-CM | POA: Diagnosis not present

## 2021-08-25 DIAGNOSIS — F4312 Post-traumatic stress disorder, chronic: Secondary | ICD-10-CM | POA: Diagnosis not present

## 2021-08-25 DIAGNOSIS — F332 Major depressive disorder, recurrent severe without psychotic features: Secondary | ICD-10-CM | POA: Diagnosis not present

## 2021-08-29 DIAGNOSIS — F4310 Post-traumatic stress disorder, unspecified: Secondary | ICD-10-CM | POA: Diagnosis not present

## 2021-08-31 DIAGNOSIS — F4310 Post-traumatic stress disorder, unspecified: Secondary | ICD-10-CM | POA: Diagnosis not present

## 2021-08-31 DIAGNOSIS — G8929 Other chronic pain: Secondary | ICD-10-CM | POA: Diagnosis not present

## 2021-08-31 DIAGNOSIS — M5416 Radiculopathy, lumbar region: Secondary | ICD-10-CM | POA: Diagnosis not present

## 2021-09-05 DIAGNOSIS — F4310 Post-traumatic stress disorder, unspecified: Secondary | ICD-10-CM | POA: Diagnosis not present

## 2021-09-07 DIAGNOSIS — F4310 Post-traumatic stress disorder, unspecified: Secondary | ICD-10-CM | POA: Diagnosis not present

## 2021-09-12 DIAGNOSIS — F4310 Post-traumatic stress disorder, unspecified: Secondary | ICD-10-CM | POA: Diagnosis not present

## 2021-09-14 DIAGNOSIS — F4310 Post-traumatic stress disorder, unspecified: Secondary | ICD-10-CM | POA: Diagnosis not present

## 2021-09-19 DIAGNOSIS — F4310 Post-traumatic stress disorder, unspecified: Secondary | ICD-10-CM | POA: Diagnosis not present

## 2021-09-21 DIAGNOSIS — F4310 Post-traumatic stress disorder, unspecified: Secondary | ICD-10-CM | POA: Diagnosis not present

## 2021-09-26 DIAGNOSIS — F4310 Post-traumatic stress disorder, unspecified: Secondary | ICD-10-CM | POA: Diagnosis not present

## 2021-09-28 DIAGNOSIS — F4310 Post-traumatic stress disorder, unspecified: Secondary | ICD-10-CM | POA: Diagnosis not present

## 2021-10-01 DIAGNOSIS — G8929 Other chronic pain: Secondary | ICD-10-CM | POA: Diagnosis not present

## 2021-10-01 DIAGNOSIS — M5416 Radiculopathy, lumbar region: Secondary | ICD-10-CM | POA: Diagnosis not present

## 2021-10-03 DIAGNOSIS — F4310 Post-traumatic stress disorder, unspecified: Secondary | ICD-10-CM | POA: Diagnosis not present

## 2021-10-05 DIAGNOSIS — F4310 Post-traumatic stress disorder, unspecified: Secondary | ICD-10-CM | POA: Diagnosis not present

## 2021-10-06 DIAGNOSIS — M65331 Trigger finger, right middle finger: Secondary | ICD-10-CM | POA: Diagnosis not present

## 2021-10-10 DIAGNOSIS — F332 Major depressive disorder, recurrent severe without psychotic features: Secondary | ICD-10-CM | POA: Diagnosis not present

## 2021-10-10 DIAGNOSIS — F4312 Post-traumatic stress disorder, chronic: Secondary | ICD-10-CM | POA: Diagnosis not present

## 2021-10-10 DIAGNOSIS — F4310 Post-traumatic stress disorder, unspecified: Secondary | ICD-10-CM | POA: Diagnosis not present

## 2021-10-12 DIAGNOSIS — F4310 Post-traumatic stress disorder, unspecified: Secondary | ICD-10-CM | POA: Diagnosis not present

## 2021-10-17 DIAGNOSIS — F4310 Post-traumatic stress disorder, unspecified: Secondary | ICD-10-CM | POA: Diagnosis not present

## 2021-10-19 DIAGNOSIS — M65331 Trigger finger, right middle finger: Secondary | ICD-10-CM | POA: Diagnosis not present

## 2021-10-19 DIAGNOSIS — F4310 Post-traumatic stress disorder, unspecified: Secondary | ICD-10-CM | POA: Diagnosis not present

## 2021-10-26 DIAGNOSIS — F4310 Post-traumatic stress disorder, unspecified: Secondary | ICD-10-CM | POA: Diagnosis not present

## 2021-10-31 DIAGNOSIS — F4310 Post-traumatic stress disorder, unspecified: Secondary | ICD-10-CM | POA: Diagnosis not present

## 2021-10-31 DIAGNOSIS — F332 Major depressive disorder, recurrent severe without psychotic features: Secondary | ICD-10-CM | POA: Diagnosis not present

## 2021-10-31 DIAGNOSIS — F4312 Post-traumatic stress disorder, chronic: Secondary | ICD-10-CM | POA: Diagnosis not present

## 2021-11-02 DIAGNOSIS — F4310 Post-traumatic stress disorder, unspecified: Secondary | ICD-10-CM | POA: Diagnosis not present

## 2021-11-07 DIAGNOSIS — E669 Obesity, unspecified: Secondary | ICD-10-CM | POA: Diagnosis not present

## 2021-11-07 DIAGNOSIS — E039 Hypothyroidism, unspecified: Secondary | ICD-10-CM | POA: Diagnosis not present

## 2021-11-07 DIAGNOSIS — E1165 Type 2 diabetes mellitus with hyperglycemia: Secondary | ICD-10-CM | POA: Diagnosis not present

## 2021-11-07 DIAGNOSIS — E559 Vitamin D deficiency, unspecified: Secondary | ICD-10-CM | POA: Diagnosis not present

## 2021-11-07 DIAGNOSIS — F4310 Post-traumatic stress disorder, unspecified: Secondary | ICD-10-CM | POA: Diagnosis not present

## 2021-11-09 DIAGNOSIS — F4310 Post-traumatic stress disorder, unspecified: Secondary | ICD-10-CM | POA: Diagnosis not present

## 2021-11-14 DIAGNOSIS — F4310 Post-traumatic stress disorder, unspecified: Secondary | ICD-10-CM | POA: Diagnosis not present

## 2021-11-16 DIAGNOSIS — F4310 Post-traumatic stress disorder, unspecified: Secondary | ICD-10-CM | POA: Diagnosis not present

## 2021-11-21 DIAGNOSIS — F4310 Post-traumatic stress disorder, unspecified: Secondary | ICD-10-CM | POA: Diagnosis not present

## 2021-11-23 DIAGNOSIS — F4310 Post-traumatic stress disorder, unspecified: Secondary | ICD-10-CM | POA: Diagnosis not present

## 2021-11-28 DIAGNOSIS — F4310 Post-traumatic stress disorder, unspecified: Secondary | ICD-10-CM | POA: Diagnosis not present

## 2021-11-30 DIAGNOSIS — F4310 Post-traumatic stress disorder, unspecified: Secondary | ICD-10-CM | POA: Diagnosis not present

## 2021-11-30 DIAGNOSIS — F332 Major depressive disorder, recurrent severe without psychotic features: Secondary | ICD-10-CM | POA: Diagnosis not present

## 2021-11-30 DIAGNOSIS — F4312 Post-traumatic stress disorder, chronic: Secondary | ICD-10-CM | POA: Diagnosis not present

## 2021-12-05 DIAGNOSIS — F4310 Post-traumatic stress disorder, unspecified: Secondary | ICD-10-CM | POA: Diagnosis not present

## 2021-12-07 DIAGNOSIS — F4310 Post-traumatic stress disorder, unspecified: Secondary | ICD-10-CM | POA: Diagnosis not present

## 2021-12-12 DIAGNOSIS — F4310 Post-traumatic stress disorder, unspecified: Secondary | ICD-10-CM | POA: Diagnosis not present

## 2021-12-14 DIAGNOSIS — F4310 Post-traumatic stress disorder, unspecified: Secondary | ICD-10-CM | POA: Diagnosis not present

## 2021-12-19 DIAGNOSIS — F4310 Post-traumatic stress disorder, unspecified: Secondary | ICD-10-CM | POA: Diagnosis not present

## 2021-12-21 DIAGNOSIS — F4310 Post-traumatic stress disorder, unspecified: Secondary | ICD-10-CM | POA: Diagnosis not present

## 2021-12-26 DIAGNOSIS — F4310 Post-traumatic stress disorder, unspecified: Secondary | ICD-10-CM | POA: Diagnosis not present

## 2021-12-28 DIAGNOSIS — F4312 Post-traumatic stress disorder, chronic: Secondary | ICD-10-CM | POA: Diagnosis not present

## 2021-12-28 DIAGNOSIS — F4310 Post-traumatic stress disorder, unspecified: Secondary | ICD-10-CM | POA: Diagnosis not present

## 2021-12-28 DIAGNOSIS — F332 Major depressive disorder, recurrent severe without psychotic features: Secondary | ICD-10-CM | POA: Diagnosis not present

## 2022-01-02 DIAGNOSIS — F4310 Post-traumatic stress disorder, unspecified: Secondary | ICD-10-CM | POA: Diagnosis not present

## 2022-01-04 DIAGNOSIS — F4310 Post-traumatic stress disorder, unspecified: Secondary | ICD-10-CM | POA: Diagnosis not present

## 2022-01-09 DIAGNOSIS — F4310 Post-traumatic stress disorder, unspecified: Secondary | ICD-10-CM | POA: Diagnosis not present

## 2022-01-11 DIAGNOSIS — F4310 Post-traumatic stress disorder, unspecified: Secondary | ICD-10-CM | POA: Diagnosis not present

## 2022-01-16 DIAGNOSIS — F4310 Post-traumatic stress disorder, unspecified: Secondary | ICD-10-CM | POA: Diagnosis not present

## 2022-01-18 DIAGNOSIS — F4310 Post-traumatic stress disorder, unspecified: Secondary | ICD-10-CM | POA: Diagnosis not present

## 2022-01-23 DIAGNOSIS — F4310 Post-traumatic stress disorder, unspecified: Secondary | ICD-10-CM | POA: Diagnosis not present

## 2022-01-25 DIAGNOSIS — F4310 Post-traumatic stress disorder, unspecified: Secondary | ICD-10-CM | POA: Diagnosis not present

## 2022-01-30 DIAGNOSIS — F4310 Post-traumatic stress disorder, unspecified: Secondary | ICD-10-CM | POA: Diagnosis not present

## 2022-02-01 DIAGNOSIS — F4310 Post-traumatic stress disorder, unspecified: Secondary | ICD-10-CM | POA: Diagnosis not present

## 2022-02-06 DIAGNOSIS — F4310 Post-traumatic stress disorder, unspecified: Secondary | ICD-10-CM | POA: Diagnosis not present

## 2022-02-08 DIAGNOSIS — F4310 Post-traumatic stress disorder, unspecified: Secondary | ICD-10-CM | POA: Diagnosis not present

## 2022-02-13 DIAGNOSIS — F4310 Post-traumatic stress disorder, unspecified: Secondary | ICD-10-CM | POA: Diagnosis not present

## 2022-02-15 DIAGNOSIS — F4310 Post-traumatic stress disorder, unspecified: Secondary | ICD-10-CM | POA: Diagnosis not present

## 2022-02-20 DIAGNOSIS — F4310 Post-traumatic stress disorder, unspecified: Secondary | ICD-10-CM | POA: Diagnosis not present

## 2022-02-22 DIAGNOSIS — F4310 Post-traumatic stress disorder, unspecified: Secondary | ICD-10-CM | POA: Diagnosis not present

## 2022-02-27 DIAGNOSIS — F4310 Post-traumatic stress disorder, unspecified: Secondary | ICD-10-CM | POA: Diagnosis not present

## 2022-03-01 DIAGNOSIS — F4310 Post-traumatic stress disorder, unspecified: Secondary | ICD-10-CM | POA: Diagnosis not present

## 2022-03-03 DIAGNOSIS — Z6841 Body Mass Index (BMI) 40.0 and over, adult: Secondary | ICD-10-CM | POA: Diagnosis not present

## 2022-03-03 DIAGNOSIS — G8929 Other chronic pain: Secondary | ICD-10-CM | POA: Diagnosis not present

## 2022-03-03 DIAGNOSIS — M5416 Radiculopathy, lumbar region: Secondary | ICD-10-CM | POA: Diagnosis not present

## 2022-03-06 DIAGNOSIS — F4310 Post-traumatic stress disorder, unspecified: Secondary | ICD-10-CM | POA: Diagnosis not present

## 2022-03-08 DIAGNOSIS — F4310 Post-traumatic stress disorder, unspecified: Secondary | ICD-10-CM | POA: Diagnosis not present

## 2022-03-29 ENCOUNTER — Other Ambulatory Visit: Payer: Self-pay | Admitting: Family Medicine

## 2022-03-29 DIAGNOSIS — Z1231 Encounter for screening mammogram for malignant neoplasm of breast: Secondary | ICD-10-CM

## 2022-04-04 ENCOUNTER — Telehealth: Payer: Self-pay | Admitting: *Deleted

## 2022-04-04 NOTE — Chronic Care Management (AMB) (Unsigned)
  Care Management   Outreach Note  04/04/2022 Name: April Scott MRN: [id number] DOB: 20-Sep-1960  An unsuccessful telephone outreach was attempted today. The patient was referred to the case management team for assistance with care management and care coordination.   Follow Up Plan:  A HIPAA compliant phone message was left for the patient providing contact information and requesting a return call.  The care management team will reach out to the patient again over the next 7 days.  If patient returns call to provider office, please advise to call [facility]* at [phone].*  [facility]  Direct Dial: [phone]

## 2022-04-05 NOTE — Chronic Care Management (AMB) (Signed)
  Care Coordination  Note  04/05/2022 Name: April Scott MRN: [id number] DOB: 1961/02/25  April Scott is a 61 y.o. year old female who is a primary care patient of Janie Morning, DO. I reached out to [employer] by phone today to offer care coordination services.      April Scott was given information about Care Coordination services today including:  The Care Coordination services include support from the care team which includes your Nurse Coordinator, Clinical Social Worker, or Pharmacist.  The Care Coordination team is here to help remove barriers to the health concerns and goals most important to you. Care Coordination services are voluntary and the patient may decline or stop services at any time by request to their care team member.   Patient did not agree to participate in care coordination services at this time.  Follow up plan: Patient declines further follow up or participation in care coordination services.   [place]  Direct Dial: [phone]

## 2022-05-22 DIAGNOSIS — F4310 Post-traumatic stress disorder, unspecified: Secondary | ICD-10-CM | POA: Diagnosis not present

## 2022-05-24 DIAGNOSIS — F4310 Post-traumatic stress disorder, unspecified: Secondary | ICD-10-CM | POA: Diagnosis not present

## 2022-05-29 DIAGNOSIS — F4310 Post-traumatic stress disorder, unspecified: Secondary | ICD-10-CM | POA: Diagnosis not present

## 2022-05-31 DIAGNOSIS — F4310 Post-traumatic stress disorder, unspecified: Secondary | ICD-10-CM | POA: Diagnosis not present

## 2022-06-05 DIAGNOSIS — F4310 Post-traumatic stress disorder, unspecified: Secondary | ICD-10-CM | POA: Diagnosis not present

## 2022-06-07 DIAGNOSIS — F4310 Post-traumatic stress disorder, unspecified: Secondary | ICD-10-CM | POA: Diagnosis not present

## 2022-06-12 DIAGNOSIS — F4310 Post-traumatic stress disorder, unspecified: Secondary | ICD-10-CM | POA: Diagnosis not present

## 2022-06-14 DIAGNOSIS — F4310 Post-traumatic stress disorder, unspecified: Secondary | ICD-10-CM | POA: Diagnosis not present

## 2022-06-19 DIAGNOSIS — H2512 Age-related nuclear cataract, left eye: Secondary | ICD-10-CM | POA: Diagnosis not present

## 2022-06-19 DIAGNOSIS — F4310 Post-traumatic stress disorder, unspecified: Secondary | ICD-10-CM | POA: Diagnosis not present

## 2022-06-21 DIAGNOSIS — F4310 Post-traumatic stress disorder, unspecified: Secondary | ICD-10-CM | POA: Diagnosis not present

## 2022-06-26 DIAGNOSIS — F4310 Post-traumatic stress disorder, unspecified: Secondary | ICD-10-CM | POA: Diagnosis not present

## 2022-06-28 DIAGNOSIS — F4310 Post-traumatic stress disorder, unspecified: Secondary | ICD-10-CM | POA: Diagnosis not present

## 2022-07-03 DIAGNOSIS — F4310 Post-traumatic stress disorder, unspecified: Secondary | ICD-10-CM | POA: Diagnosis not present

## 2022-07-05 DIAGNOSIS — F4310 Post-traumatic stress disorder, unspecified: Secondary | ICD-10-CM | POA: Diagnosis not present

## 2022-07-10 DIAGNOSIS — F4310 Post-traumatic stress disorder, unspecified: Secondary | ICD-10-CM | POA: Diagnosis not present

## 2022-07-12 DIAGNOSIS — F4310 Post-traumatic stress disorder, unspecified: Secondary | ICD-10-CM | POA: Diagnosis not present

## 2022-07-17 DIAGNOSIS — E669 Obesity, unspecified: Secondary | ICD-10-CM | POA: Diagnosis not present

## 2022-07-17 DIAGNOSIS — F4310 Post-traumatic stress disorder, unspecified: Secondary | ICD-10-CM | POA: Diagnosis not present

## 2022-07-17 DIAGNOSIS — E1165 Type 2 diabetes mellitus with hyperglycemia: Secondary | ICD-10-CM | POA: Diagnosis not present

## 2022-07-17 DIAGNOSIS — Z6841 Body Mass Index (BMI) 40.0 and over, adult: Secondary | ICD-10-CM | POA: Diagnosis not present

## 2022-07-17 DIAGNOSIS — E039 Hypothyroidism, unspecified: Secondary | ICD-10-CM | POA: Diagnosis not present

## 2022-07-19 DIAGNOSIS — F4310 Post-traumatic stress disorder, unspecified: Secondary | ICD-10-CM | POA: Diagnosis not present

## 2022-07-24 DIAGNOSIS — F4310 Post-traumatic stress disorder, unspecified: Secondary | ICD-10-CM | POA: Diagnosis not present

## 2022-07-26 DIAGNOSIS — F4310 Post-traumatic stress disorder, unspecified: Secondary | ICD-10-CM | POA: Diagnosis not present

## 2024-01-21 DIAGNOSIS — F332 Major depressive disorder, recurrent severe without psychotic features: Secondary | ICD-10-CM | POA: Diagnosis not present

## 2024-01-21 DIAGNOSIS — F4312 Post-traumatic stress disorder, chronic: Secondary | ICD-10-CM | POA: Diagnosis not present

## 2024-06-19 DIAGNOSIS — K080 Exfoliation of teeth due to systemic causes: Secondary | ICD-10-CM | POA: Diagnosis not present

## 2024-07-09 DIAGNOSIS — K080 Exfoliation of teeth due to systemic causes: Secondary | ICD-10-CM | POA: Diagnosis not present

## 2024-07-28 DIAGNOSIS — Z1231 Encounter for screening mammogram for malignant neoplasm of breast: Secondary | ICD-10-CM | POA: Diagnosis not present

## 2024-08-06 DIAGNOSIS — K080 Exfoliation of teeth due to systemic causes: Secondary | ICD-10-CM | POA: Diagnosis not present
